# Patient Record
Sex: Female | Born: 1980 | Race: Black or African American | Hispanic: No | Marital: Married | Smoking: Never smoker
Health system: Southern US, Community
[De-identification: ages and names within clinical notes are randomized; demographics above are authoritative.]

## PROBLEM LIST (undated history)

## (undated) DIAGNOSIS — E282 Polycystic ovarian syndrome: Secondary | ICD-10-CM

## (undated) DIAGNOSIS — Z8619 Personal history of other infectious and parasitic diseases: Secondary | ICD-10-CM

## (undated) HISTORY — PX: PILONIDAL CYST DRAINAGE: SHX743

## (undated) HISTORY — PX: CHOLECYSTECTOMY: SHX55

---
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
## 2000-03-17 ENCOUNTER — Other Ambulatory Visit: Admission: RE | Admit: 2000-03-17 | Discharge: 2000-03-17 | Payer: Self-pay | Admitting: Family Medicine

## 2001-04-20 ENCOUNTER — Other Ambulatory Visit: Admission: RE | Admit: 2001-04-20 | Discharge: 2001-04-20 | Payer: Self-pay | Admitting: Family Medicine

## 2001-04-20 ENCOUNTER — Other Ambulatory Visit: Admission: RE | Admit: 2001-04-20 | Discharge: 2001-04-20 | Payer: Self-pay | Admitting: *Deleted

## 2002-10-17 ENCOUNTER — Other Ambulatory Visit: Admission: RE | Admit: 2002-10-17 | Discharge: 2002-10-17 | Payer: Self-pay | Admitting: Family Medicine

## 2003-01-31 ENCOUNTER — Ambulatory Visit (HOSPITAL_COMMUNITY): Admission: RE | Admit: 2003-01-31 | Discharge: 2003-01-31 | Payer: Self-pay | Admitting: *Deleted

## 2003-02-07 ENCOUNTER — Ambulatory Visit (HOSPITAL_COMMUNITY): Admission: RE | Admit: 2003-02-07 | Discharge: 2003-02-07 | Payer: Self-pay | Admitting: *Deleted

## 2003-04-12 ENCOUNTER — Ambulatory Visit (HOSPITAL_COMMUNITY): Admission: RE | Admit: 2003-04-12 | Discharge: 2003-04-12 | Payer: Self-pay | Admitting: Family Medicine

## 2003-06-13 ENCOUNTER — Ambulatory Visit (HOSPITAL_COMMUNITY): Admission: RE | Admit: 2003-06-13 | Discharge: 2003-06-13 | Payer: Self-pay | Admitting: Family Medicine

## 2003-06-30 ENCOUNTER — Inpatient Hospital Stay (HOSPITAL_COMMUNITY): Admission: AD | Admit: 2003-06-30 | Discharge: 2003-07-04 | Payer: Self-pay | Admitting: *Deleted

## 2004-09-12 ENCOUNTER — Other Ambulatory Visit: Admission: RE | Admit: 2004-09-12 | Discharge: 2004-09-12 | Payer: Self-pay | Admitting: Family Medicine

## 2005-09-11 ENCOUNTER — Other Ambulatory Visit: Admission: RE | Admit: 2005-09-11 | Discharge: 2005-09-11 | Payer: Self-pay | Admitting: Family Medicine

## 2007-03-14 ENCOUNTER — Emergency Department (HOSPITAL_COMMUNITY): Admission: EM | Admit: 2007-03-14 | Discharge: 2007-03-14 | Payer: Self-pay | Admitting: Emergency Medicine

## 2007-06-20 ENCOUNTER — Inpatient Hospital Stay (HOSPITAL_COMMUNITY): Admission: AD | Admit: 2007-06-20 | Discharge: 2007-06-20 | Payer: Self-pay | Admitting: Obstetrics and Gynecology

## 2008-01-07 ENCOUNTER — Other Ambulatory Visit: Payer: Self-pay | Admitting: Obstetrics and Gynecology

## 2008-01-10 ENCOUNTER — Inpatient Hospital Stay (HOSPITAL_COMMUNITY): Admission: RE | Admit: 2008-01-10 | Discharge: 2008-01-13 | Payer: Self-pay | Admitting: Obstetrics and Gynecology

## 2010-07-23 NOTE — Op Note (Signed)
NAME:  Michelle Leon, Michelle Leon              ACCOUNT NO.:  [id number]   MEDICAL RECORD NO.:  [id number]          PATIENT TYPE:  INP   LOCATION:  9199                          FACILITY:  [facility]   PHYSICIAN:  Zelphia Cairo, MD    DATE OF BIRTH:  07-20-1980   DATE OF PROCEDURE:  01/10/2008  DATE OF DISCHARGE:                               OPERATIVE REPORT   PREOPERATIVE DIAGNOSES:  1. Intrauterine pregnancy at term.  2. Prior cesarean section, desires repeat.   PROCEDURE:  Repeat low-transverse cesarean section.   SURGEON:  Zelphia Cairo, MD   ESTIMATED BLOOD LOSS:  500 mL.   FINDINGS:  Viable female infant with Apgars of 9 and 9, weight 7 pounds 12  ounces, normal pelvic anatomy.   URINE OUTPUT:  300 mL of clear urine.   FLUIDS:  2 L.   SPECIMEN:  Placenta for disposal.   COMPLICATIONS:  None.   CONDITION:  Stable to recovery room.   PROCEDURE:  The patient was taken to the operating room where spinal  anesthesia was found to be adequate.  She was placed in the supine  position with a left tilt.  She was prepped and draped in sterile  fashion.  A Foley catheter was inserted sterilely.  Pfannenstiel skin  incision was made with a scalpel and carried down to the underlying  fascia.  The fascia was incised in the midline.  This was extended  laterally using Mayo scissors.  Kocher clamps were used to grasp the  fascia and tented upwards.  The underlying rectus muscles were dissected  off using Mayo scissors.  The peritoneum was then identified and entered  sharply with Metzenbaum scissors.  This was extended superiorly and  inferiorly with good visualization of the bladder.  The bladder blade  was then inserted, the bladder flap was created, and the bladder blade  was reinserted to protect the bladder flap.   Uterine incision was made with a scalpel and extended bluntly using my  fingers.  The fetal vertex was brought to the uterine incision with  fundal pressure.  Right rectus  muscles were noted to be tight around the  fetal vertex.  Small incision was made using Mayo scissors on the medial  portion of the right rectus muscle.  Vacuum was applied; however, the  infant had a lot of hair, the vacuum would not allow traction.  After 2  pop-offs, the patient's bed was rotated to place her completely supine.  At this point, this allowed proper fundal pressure and the fetal vertex  was delivered through the uterine incision.  The mouth and nose were  suctioned and the shoulders and body easily followed.  The cord was  clamped and cut as the infant's mouth and nose were suctioned and the  infant was taken to the awaiting pediatric staff.  The placenta was then  manually removed from the uterus.  The uterus was cleared of all clots  and debris using a dry lap sponge.  The uterine incision was closed  using a double layer closure of 0 chromic in a running locked fashion.  Once hemostasis was noted, the uterus was placed back into the pelvis  and the pelvis was irrigated with warm normal saline.  The uterine  incision was re-inspected and again found to be hemostatic.  Peritoneum  was reapproximated using 0-Monocryl.  The fascia was closed with a  looped 0 PDS, and skin was closed staples.  The patient tolerated the  procedure well.  Sponge lap, needle, and instrument counts were correct  x2.      Zelphia Cairo, MD  Electronically Signed     GA/MEDQ  D:  01/10/2008  T:  01/10/2008  Job:  [id number]

## 2010-07-23 NOTE — Discharge Summary (Signed)
NAME:  Michelle Leon, Michelle Leon              ACCOUNT NO.:  [id number]   MEDICAL RECORD NO.:  [id number]          PATIENT TYPE:  INP   LOCATION:  9120                          [facility]ACILITY:  [facility]   PHYSICIAN:  Dineen Kid. Rana Snare, M.D.    DATE OF BIRTH:  April 30, 1980   DATE OF ADMISSION:  01/10/2008  DATE OF DISCHARGE:  01/13/2008                               DISCHARGE SUMMARY   ADMITTING DIAGNOSES:  1. Intrauterine pregnancy at 52 plus weeks estimated gestational age.  2. Previous caesarean section, desires repeat.   DISCHARGE DIAGNOSES:  1. Status post low transverse caesarean section.  2. Viable female infant.   PROCEDURE:  Repeat low transverse caesarean section.   REASON FOR ADMISSION:  Please see written H and P.   HOSPITAL COURSE:  The patient is a 30 year old African American married  female gravida 2, para 1 who was admitted to [facility]  for scheduled cesarean section.  The patient had had a previous cesarean  section and desired repeat.  On the morning of admission, the patient  was taken to operating room where spinal anesthesia was administered  without difficulty.  A low transverse incision was made, delivery of a  viable female infant weighing 7 pounds 12 ounces with Apgars of 9 at 1  minute and 9 at 5 minutes.  The patient tolerated the procedure well and  was taken to the recovery room in stable condition.  Postoperative day  #1, the patient was without complaint.  Vital signs stable.  She is  afebrile.  Abdomen is soft.  Fundus is firm and nontender.  Abdominal  dressing was noted to be clean, dry, and intact.  Foley had been  discontinued and the patient did not void at the time of roundings.  Laboratory findings revealed hemoglobin of 11.9, platelet count of  163,000, WBC 7.6,  blood type is noted to be AB positive.  The following  morning the patient was without complaints.  Vitals signs remained  stable.  She was afebrile.  Fundus firm and nontender.  Incision was  clean, dry, and intact.  She was ambulating well.  Postoperative day #3,  the patient was without complaint.  Vital signs remained stable.  She is  afebrile.  Abdomen is soft.  Fundus is firm and nontender.  Incision was  clean, dry, and intact.  Staples were removed and the patient was later  discharged home.   CONDITION ON DISCHARGE:  Stable.   DIET:  Regular as tolerated.   ACTIVITY:  No heavy lifting, no driving x2 weeks, no vaginal entry.   FOLLOWUP:  The patient is to follow up in the office in 1-2 weeks for  incision check.  She is to call for temperature greater than 100  degrees, persistent nausea, vomiting, heavy vaginal bleeding, and/or  redness or drainage at incisional site.   DISCHARGE MEDICATIONS:  1. Percocet 5/325 #30 one p.o. q.4-6 h. p.r.n.  2. Motrin 600 mg q.6 h.  3. Prenatal vitamins 1 p.o. daily.  4. Colace 1 p.o. daily p.r.n.      Julio Sicks, N.P.  Dineen Kid Rana Snare, M.D.  Electronically Signed    CC/MEDQ  D:  01/13/2008  T:  01/13/2008  Job:  [id number]

## 2010-07-26 NOTE — Discharge Summary (Signed)
NAME:  Michelle Leon, DIEFENDORF                        ACCOUNT NO.:  [id number]   MEDICAL RECORD NO.:  [id number]                   PATIENT TYPE:  INP   LOCATION:  9110                                 FACILITY:  [facility]   PHYSICIAN:  Magnus Sinning. Rice, M.D.              DATE OF BIRTH:  21-Oct-1980   DATE OF ADMISSION:  06/30/2003  DATE OF DISCHARGE:  07/04/2003                                 DISCHARGE SUMMARY   DISCHARGE DIAGNOSES:  1. Intrauterine pregnancy at 39 weeks, delivered.  2. Low transverse cesarean section.  3. Failure to descend.  4. Persistent right occiput posterior position.  5. Fever in labor.   DISCHARGE MEDICATIONS:  1. Percocet 5/325 one to two p.o. q.4-6h. p.r.n. pain.  2. Motrin 600 mg one p.o. t.i.d. with food.  3. Micronor start on Sunday, Jul 14, 2003.  4. Prenatal vitamins one per day as long as breastfeeding.   DISCHARGE INSTRUCTIONS:  Discharged to home with follow-up at [facility] in 6 weeks.   HISTORY OF PRESENT ILLNESS:  This is a 30 year old G1 at 44 weeks by a sure  normal LMP but 10 days behind by a 17-week ultrasound.  Onset of strong  contractions on the day of admission with a gush of clear fluid.  No vaginal  bleeding and good fetal movement were noted.  This pregnancy was complicated  by second trimester bleeding, not recurrent.   PAST MEDICAL HISTORY:  None.  An antiphospholipid workup was negative.   MEDICATIONS:  Prenatal vitamins and Flagyl.   ALLERGIES:  None.   PAST OBSTETRICAL HISTORY:  In July 2000 at TAB at 4 weeks.   PRENATAL LABORATORY DATA:  AB positive, antibody negative.  RPR nonreactive.  Rubella equivocal.  HB surface antigen negative.  HIV negative.  Pap within  normal limits.  GC and chlamydia were negative.  Glucola was 102.  GBS was  negative on May 29, 2003.   SOCIAL HISTORY:  Planned breastfeeding, [facility] for pediatrics,  and OCPs postpartum.   PHYSICAL EXAMINATION:  Normal at  admission with fetal heart tones 145-150,  accelerations present, occasional mild variables, and contractions every 2-3  minutes.  SVE on admission was 4-5 cm, 95% effaced, vertex presentation, and  -1 station.  There was copious clear fluid.   The patient was therefore admitted.  Her labor was followed throughout the  evening.  She was noted to have a fever to 99.5 with a slightly increased  fetal heart tone baseline at about 2:15 a.m. on April 23.  She was therefore  started on ampicillin and given Tylenol.  The patient was noted to be  completely dilated at 5:45 a.m. and pushing for an hour with good effort  noted and not much change in station.  Position at that time was noted to be  right occiput transverse.  The patient was continued on pushing for  approximately a further  hour-and-a-half and at 9 a.m. after trying various  positions with the vertex unchanged in station the decision was decided to  progress to primary low transverse cesarean section.  Dr. Penne Lash was  consulted in this and agreed to the surgery as well and participated as the  primary.  The patient was easily delivered by primary LTCS, a viable female  with Apgars of 9 at one and 9 at five and a 7-pound 10-ounce baby was the  result.  The patient recovered well after her surgery and was discharged on  July 04, 2003 with the discharge diagnoses and medications as listed above.                                               Magnus Sinning. Rice, M.D.    KMR/MEDQ  D:  07/30/2003  T:  07/31/2003  Job:  [id number]

## 2010-07-26 NOTE — Op Note (Signed)
NAME:  Michelle Leon, Michelle Leon                        ACCOUNT NO.:  [id number]   MEDICAL RECORD NO.:  [id number]                   PATIENT TYPE:  INP   LOCATION:  9110                                 [facility]ACILITY:  [facility]   PHYSICIAN:  Lesly Dukes, M.D.              DATE OF BIRTH:  1981-02-19   DATE OF PROCEDURE:  07/01/2003  DATE OF DISCHARGE:  07/04/2003                                 OPERATIVE REPORT   PREOPERATIVE DIAGNOSES:  1. Intrauterine pregnancy at 39 weeks.  2. Failure to descend.  3. Persistent right occiput posterior position.  4. Fever and labor.   POSTOPERATIVE DIAGNOSES:  1. Intrauterine pregnancy at 39 weeks.  2. Failure to descend.  3. Persistent right occiput posterior position.  4. Fever and labor.   PROCEDURE:  Primary low transverse cesarean section by a Pfannenstiel  incision.   SURGEON:  Lesly Dukes, M.D.   ASSISTANT:  Magnus Sinning. Rice, M.D.   FINDINGS:  A viable female infant ROP position, 7 pounds 10 ounces, Apgar at  9 at 1 and 9 at 5 minutes.   ESTIMATED BLOOD LOSS:  750 mL   URINE OUTPUT:  400 mL   FLUIDS:  2200 of LR.   ANESTHESIA:  Epidural.   COMPLICATIONS:  None.   The patient was taken to the operating room where the epidural anesthesia  was dosed and then found to be adequate. She was prepped and draped in the  normal sterile fashion in dorsal supine position. A Pfannenstiel skin  incision was made with the scalpel and carried through to the underlying  layer of fascia with the Bovie. The fascia was nicked in the midline and the  incision extended laterally with Mayo scissors. The inferior aspect of the  fascial incision was grasped with Kocher clamps, elevated and the underlying  rectus muscles dissected off bluntly. The superior aspect of the fascial  incision was treated the same way. The rectus muscles were separated in the  midline and perineum identified and tinted up with hemostats, entered  sharply with Metzenbaum scissors.   The peritoneal incision was extended  superiorly and inferiorly with good visualization of the bladder.  The  bladder blade was inserted and the vesicouterine peritoneum identified,  grasped with pickups and entered sharply with Metzenbaum scissors.  This  incision was extended laterally and the bladder flap created digitally. The  bladder blade was reinserted and the lower uterine segment incised in a  transverse fashion with a scalpel.  The uterine incision was extended  laterally digitally.  The bladder blade was removed and the infant's head  was delivered atraumatically.  Nose and mouth were suctioned with the  suction bulb, the cord then clamped and cut. The infant was handed off to  waiting pediatricians and cord blood was drawn.  The placenta was removed  manually, the uterus cleared of all clots and debris and then the uterine  incision  was repaired with Vicryl in a running locked fashion. A second  layer of the same suture was used to obtain incision stability and extra  hemostasis. The gutters were cleared of all clots and the fascia was  reapproximated with #0 Vicryl in a running fashion. The skin was closed with  staples.  The patient tolerated the procedure well, sponge, lap and needle  counts were correct x2.  Ancef 1 g was given at cord clamp and the patient  was sent to the recovery room in stable condition.     Magnus Sinning. Dimple Casey, M.D.                    Lesly Dukes, M.D.    KMR/MEDQ  D:  07/04/2003  T:  07/04/2003  Job:  [id number]   cc:   Lesly Dukes, M.D.

## 2010-12-03 LAB — ABO/RH: ABO/RH(D): AB POS

## 2010-12-09 LAB — CBC
HCT: 37
Hemoglobin: 12.4
MCHC: 33.5
MCV: 93.3
Platelets: 166
RBC: 3.96
RDW: 12.8
WBC: 5.6

## 2010-12-09 LAB — RPR: RPR Ser Ql: NONREACTIVE

## 2010-12-09 LAB — TYPE AND SCREEN
ABO/RH(D): AB POS
Antibody Screen: NEGATIVE

## 2010-12-09 LAB — ABO/RH: ABO/RH(D): AB POS

## 2010-12-10 LAB — CBC
HCT: 35.3 — ABNORMAL LOW
Hemoglobin: 11.9 — ABNORMAL LOW
MCHC: 33.7
MCV: 93.9
Platelets: 163
RBC: 3.76 — ABNORMAL LOW
RDW: 12.5
WBC: 7.6

## 2012-11-04 ENCOUNTER — Ambulatory Visit: Payer: Self-pay | Admitting: Nurse Practitioner

## 2012-11-04 ENCOUNTER — Encounter: Payer: Self-pay | Admitting: *Deleted

## 2012-11-04 ENCOUNTER — Telehealth: Payer: Self-pay | Admitting: Nurse Practitioner

## 2012-11-04 ENCOUNTER — Emergency Department
Admission: EM | Admit: 2012-11-04 | Discharge: 2012-11-04 | Disposition: A | Discharge status: Home / Self Care | Payer: BC Managed Care – PPO | Source: Home / Self Care | Attending: Family Medicine | Admitting: Family Medicine

## 2012-11-04 DIAGNOSIS — L732 Hidradenitis suppurativa: Secondary | ICD-10-CM

## 2012-11-04 LAB — POCT CBC W AUTO DIFF (K'VILLE URGENT CARE)

## 2012-11-04 MED ORDER — DOXYCYCLINE HYCLATE 100 MG PO CAPS: 100.0000 mg | ORAL_CAPSULE | Freq: Two times a day (BID) | ORAL | Status: AC

## 2012-11-04 MED ORDER — MELOXICAM 15 MG PO TABS: 15.0000 mg | ORAL_TABLET | Freq: Every day | ORAL | Status: DC

## 2012-11-04 NOTE — Telephone Encounter (Signed)
APPT MADE

## 2012-11-04 NOTE — ED Notes (Signed)
Patient c/o boil on right axilla x 1 wk patient states the boil busted yesterday. 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 Patient also c/o knot on right mid arm.

## 2012-11-04 NOTE — ED Provider Notes (Addendum)
CSN: [id number]     Arrival date & time 11/04/12  1839 History   First MD Initiated Contact with Patient 11/04/12 1908     Chief Complaint  Patient presents with  . Recurrent Skin Infections    HPI R axillary pain x 1 week  Shaves under her arms every other day.  Has had redness and purulent drainage No fevers or chills.   Also with R proximal lateral forearm pain and swelling Present over last 1-2 days.  No known injury.  Mild pain with wrist pronation/supaination.  Has noticed mild tingling over affected area.  Grip strength intact.  Pt denies doing any heavy manual labor.     Past Medical History  Diagnosis Date  . MRSA (methicillin resistant Staphylococcus aureus)    Past Surgical History  Procedure Laterality Date  . Cholecystectomy    . Cesarean section     Family History  Problem Relation Age of Onset  . Hypertension Other   . Hyperlipidemia Other    History  Substance Use Topics  . Smoking status: Never Smoker   . Smokeless tobacco: Not on file  . Alcohol Use: No   OB History   Grav Para Term Preterm Abortions TAB SAB Ect Mult Living                 Review of Systems  All other systems reviewed and are negative.    Allergies  Review of patient's allergies indicates no known allergies.  Home Medications   Current Outpatient Rx  Name  Route  Sig  Dispense  Refill  . levonorgestrel (MIRENA) 20 MCG/24HR IUD   Intrauterine   1 each by Intrauterine route once.          BP 128/85  Pulse 73  Temp(Src) 98.8 F (37.1 C) (Oral)  Resp 16  Ht 4\' 11"  (1.499 m)  Wt 173 lb (78.472 kg)  BMI 34.92 kg/m2  SpO2 99% Physical Exam  Constitutional: She appears well-developed and well-nourished.  HENT:  Head: Normocephalic and atraumatic.  Eyes: Conjunctivae are normal. Pupils are equal, round, and reactive to light.  Neck: Normal range of motion.  Cardiovascular: Normal rate and regular rhythm.   Pulmonary/Chest: Effort normal.  Abdominal: Soft.    Musculoskeletal:       Arms: Skin: Skin is warm.       ED Course  Procedures (including critical care time) Labs Review Labs Reviewed  SEDIMENTATION RATE  CK  POCT CBC W AUTO DIFF ([facility])   Imaging Review No results found.  MDM   1. Hidradenitis   2. Tendinitis of forearm    Will place on the hidradenitis coverage. Discuss avoiding shaving he Discuss infectious red flags.  Differential diagnosis for right forearm includes lateral epicondylitis, forearm tendinitis/tenosynovitis. Placed on Mobic for inflammatory coverage. Check baseline labs including CBC, CK, sedimentation rate. No leukocytosis today. Discussed with patient if symptoms don't improve the next 1-2 days to followup with sports medicine to rule out something like a cyst.  Also discussed MSK red flags including worsening pain, swelling, numbness.  Patient expressed understanding of plan. Followup as needed    The patient and/or caregiver has been counseled thoroughly with regard to treatment plan and/or medications prescribed including dosage, schedule, interactions, rationale for use, and possible side effects and they verbalize understanding. Diagnoses and expected course of recovery discussed and will return if not improved as expected or if the condition worsens. Patient and/or caregiver verbalized understanding.  Doree Albee, MD 11/04/12 Ninfa Linden  Doree Albee, MD 11/04/12 [phone]

## 2012-11-05 LAB — SEDIMENTATION RATE: Sed Rate: 24 mm/hr — ABNORMAL HIGH (ref 0–22)

## 2012-11-05 LAB — CK: Total CK: 143 U/L (ref 7–177)

## 2012-11-08 ENCOUNTER — Telehealth: Payer: Self-pay | Admitting: Emergency Medicine

## 2012-12-24 ENCOUNTER — Ambulatory Visit (INDEPENDENT_AMBULATORY_CARE_PROVIDER_SITE_OTHER): Payer: BC Managed Care – PPO | Admitting: General Practice

## 2012-12-24 ENCOUNTER — Telehealth: Payer: Self-pay | Admitting: Nurse Practitioner

## 2012-12-24 ENCOUNTER — Encounter: Payer: Self-pay | Admitting: General Practice

## 2012-12-24 VITALS — BP 106/74 | HR 70 | Temp 99.9°F | Ht 59.0 in | Wt 168.0 lb

## 2012-12-24 DIAGNOSIS — R51 Headache: Secondary | ICD-10-CM

## 2012-12-24 DIAGNOSIS — R509 Fever, unspecified: Secondary | ICD-10-CM

## 2012-12-24 DIAGNOSIS — R42 Dizziness and giddiness: Secondary | ICD-10-CM

## 2012-12-24 DIAGNOSIS — R110 Nausea: Secondary | ICD-10-CM

## 2012-12-24 LAB — POCT CBC
Granulocyte percent: 79.9 %G (ref 37–80)
HCT, POC: 39.6 % (ref 37.7–47.9)
Hemoglobin: 13.4 g/dL (ref 12.2–16.2)
Lymph, poc: 0.7 (ref 0.6–3.4)
MCH, POC: 30.4 pg (ref 27–31.2)
MCHC: 34 g/dL (ref 31.8–35.4)
MCV: 89.4 fL (ref 80–97)
MPV: 6.9 fL (ref 0–99.8)
POC Granulocyte: 4.2 (ref 2–6.9)
POC LYMPH PERCENT: 13.5 %L (ref 10–50)
Platelet Count, POC: 205 10*3/uL (ref 142–424)
RBC: 4.4 M/uL (ref 4.04–5.48)
RDW, POC: 11.3 %
WBC: 5.2 10*3/uL (ref 4.6–10.2)

## 2012-12-24 LAB — POCT INFLUENZA A/B
Influenza A, POC: NEGATIVE
Influenza B, POC: NEGATIVE

## 2012-12-24 LAB — POCT RAPID STREP A (OFFICE): Rapid Strep A Screen: NEGATIVE

## 2012-12-24 MED ORDER — KETOROLAC TROMETHAMINE 60 MG/2ML IM SOLN
60.0000 mg | Freq: Once | INTRAMUSCULAR | Status: AC
  Administered 2012-12-24: 60 mg via INTRAMUSCULAR

## 2012-12-24 MED ORDER — ONDANSETRON HCL 4 MG PO TABS
4.0000 mg | ORAL_TABLET | Freq: Once | ORAL | Status: AC
  Administered 2012-12-24: 4 mg via ORAL

## 2012-12-24 MED ORDER — ONDANSETRON HCL 4 MG PO TABS: 4.0000 mg | ORAL_TABLET | Freq: Three times a day (TID) | ORAL | Status: DC | PRN

## 2012-12-24 MED ORDER — NAPROXEN 500 MG PO TABS: 500.0000 mg | ORAL_TABLET | Freq: Two times a day (BID) | ORAL | Status: DC

## 2012-12-24 NOTE — Telephone Encounter (Signed)
appt made

## 2012-12-24 NOTE — Progress Notes (Signed)
  Subjective:    Patient ID: Michelle Leon, female    DOB: 04/24/1980, 32 y.o.   MRN: [id number]  HPI Patient presents today with complaints of dizziness and headache since lastnight. She reports one episode of vomiting lastnight. Reports having a son who was diagnosed with strep yesterday. Patient denies suffering from any recent illnesses. Denies taking OTC medications. [name] describes her headache as frontal, aching, non radiating, sensitive to light and sound. She denies headaches like this in the past with dizziness.     Review of Systems  Constitutional: Negative for fever and chills.  HENT: Negative for congestion, ear pain, postnasal drip, sinus pressure and sore throat.   Eyes: Negative for pain and discharge.  Respiratory: Negative for chest tightness and shortness of breath.   Gastrointestinal: Positive for nausea and vomiting. Negative for abdominal pain, diarrhea, constipation and blood in stool.       Vomited once lastnight  Genitourinary: Negative for hematuria, vaginal bleeding and difficulty urinating.  Musculoskeletal: Negative for neck pain.  Neurological: Positive for dizziness. Negative for weakness and numbness.       Objective:   Physical Exam  Constitutional: She is oriented to person, place, and time. She appears well-developed and well-nourished.  HENT:  Head: Normocephalic and atraumatic.  Right Ear: External ear normal.  Left Ear: External ear normal.  Nose: Nose normal.  Mouth/Throat: Oropharynx is clear and moist.  Eyes: EOM are normal. Pupils are equal, round, and reactive to light.  Neck: Normal range of motion. Neck supple. No thyromegaly present.  Cardiovascular: Normal rate, regular rhythm and normal heart sounds.   Pulmonary/Chest: Effort normal and breath sounds normal. No respiratory distress. She exhibits no tenderness.  Abdominal: Soft. Bowel sounds are normal. She exhibits no distension. There is no tenderness.  Musculoskeletal: She  exhibits no edema and no tenderness.  Lymphadenopathy:    She has no cervical adenopathy.  Neurological: She is alert and oriented to person, place, and time. No cranial nerve deficit. Coordination normal.  Skin: Skin is warm and dry.  Psychiatric: She has a normal mood and affect.          Assessment & Plan:  1. Fever  - POCT Influenza A/B - POCT CBC - POCT rapid strep A  2. Headache(784.0)  - POCT Influenza A/B - POCT CBC - POCT rapid strep A - ketorolac (TORADOL) injection 60 mg; Inject 2 mLs (60 mg total) into the muscle once. - naproxen (NAPROSYN) 500 MG tablet; Take 1 tablet (500 mg total) by mouth 2 (two) times daily with a meal.  Dispense: 30 tablet; Refill: 0  3. Dizziness  - POCT Influenza A/B - POCT CBC  4. Nausea alone  - ondansetron (ZOFRAN) tablet 4 mg; Take 1 tablet (4 mg total) by mouth once. - ondansetron (ZOFRAN) 4 MG tablet; Take 1 tablet (4 mg total) by mouth every 8 (eight) hours as needed for nausea.  Dispense: 20 tablet; Refill: 0 -increase fluids as tolerated -seek emergency medical treatment if symptoms worsen or unresolved -Patient verbalized understanding -Coralie Keens, FNP-C

## 2012-12-24 NOTE — Patient Instructions (Signed)
Headaches, Frequently Asked Questions MIGRAINE HEADACHES Q: What is migraine? What causes it? How can I treat it? A: Generally, migraine headaches begin as a dull ache. Then they develop into a constant, throbbing, and pulsating pain. You may experience pain at the temples. You may experience pain at the front or back of one or both sides of the head. The pain is usually accompanied by a combination of:  Nausea.  Vomiting.  Sensitivity to light and noise. Some people (about 15%) experience an aura (see below) before an attack. The cause of migraine is believed to be chemical reactions in the brain. Treatment for migraine may include over-the-counter or prescription medications. It may also include self-help techniques. These include relaxation training and biofeedback.  Q: What is an aura? A: About 15% of people with migraine get an "aura". This is a sign of neurological symptoms that occur before a migraine headache. You may see wavy or jagged lines, dots, or flashing lights. You might experience tunnel vision or blind spots in one or both eyes. The aura can include visual or auditory hallucinations (something imagined). It may include disruptions in smell (such as strange odors), taste or touch. Other symptoms include:  Numbness.  A "pins and needles" sensation.  Difficulty in recalling or speaking the correct word. These neurological events may last as long as 60 minutes. These symptoms will fade as the headache begins. Q: What is a trigger? A: Certain physical or environmental factors can lead to or "trigger" a migraine. These include:  Foods.  Hormonal changes.  Weather.  Stress. It is important to remember that triggers are different for everyone. To help prevent migraine attacks, you need to figure out which triggers affect you. Keep a headache diary. This is a good way to track triggers. The diary will help you talk to your healthcare professional about your condition. Q: Does  weather affect migraines? A: Bright sunshine, hot, humid conditions, and drastic changes in barometric pressure may lead to, or "trigger," a migraine attack in some people. But studies have shown that weather does not act as a trigger for everyone with migraines. Q: What is the link between migraine and hormones? A: Hormones start and regulate many of your body's functions. Hormones keep your body in balance within a constantly changing environment. The levels of hormones in your body are unbalanced at times. Examples are during menstruation, pregnancy, or menopause. That can lead to a migraine attack. In fact, about three quarters of all women with migraine report that their attacks are related to the menstrual cycle.  Q: Is there an increased risk of stroke for migraine sufferers? A: The likelihood of a migraine attack causing a stroke is very remote. That is not to say that migraine sufferers cannot have a stroke associated with their migraines. In persons under age 40, the most common associated factor for stroke is migraine headache. But over the course of a person's normal life span, the occurrence of migraine headache may actually be associated with a reduced risk of dying from cerebrovascular disease due to stroke.  Q: What are acute medications for migraine? A: Acute medications are used to treat the pain of the headache after it has started. Examples over-the-counter medications, NSAIDs, ergots, and triptans.  Q: What are the triptans? A: Triptans are the newest class of abortive medications. They are specifically targeted to treat migraine. Triptans are vasoconstrictors. They moderate some chemical reactions in the brain. The triptans work on receptors in your brain. Triptans help   to restore the balance of a neurotransmitter called serotonin. Fluctuations in levels of serotonin are thought to be a main cause of migraine.  Q: Are over-the-counter medications for migraine effective? A:  Over-the-counter, or "OTC," medications may be effective in relieving mild to moderate pain and associated symptoms of migraine. But you should see your caregiver before beginning any treatment regimen for migraine.  Q: What are preventive medications for migraine? A: Preventive medications for migraine are sometimes referred to as "prophylactic" treatments. They are used to reduce the frequency, severity, and length of migraine attacks. Examples of preventive medications include antiepileptic medications, antidepressants, beta-blockers, calcium channel blockers, and NSAIDs (nonsteroidal anti-inflammatory drugs). Q: Why are anticonvulsants used to treat migraine? A: During the past few years, there has been an increased interest in antiepileptic drugs for the prevention of migraine. They are sometimes referred to as "anticonvulsants". Both epilepsy and migraine may be caused by similar reactions in the brain.  Q: Why are antidepressants used to treat migraine? A: Antidepressants are typically used to treat people with depression. They may reduce migraine frequency by regulating chemical levels, such as serotonin, in the brain.  Q: What alternative therapies are used to treat migraine? A: The term "alternative therapies" is often used to describe treatments considered outside the scope of conventional Western medicine. Examples of alternative therapy include acupuncture, acupressure, and yoga. Another common alternative treatment is herbal therapy. Some herbs are believed to relieve headache pain. Always discuss alternative therapies with your caregiver before proceeding. Some herbal products contain arsenic and other toxins. TENSION HEADACHES Q: What is a tension-type headache? What causes it? How can I treat it? A: Tension-type headaches occur randomly. They are often the result of temporary stress, anxiety, fatigue, or anger. Symptoms include soreness in your temples, a tightening band-like sensation  around your head (a "vice-like" ache). Symptoms can also include a pulling feeling, pressure sensations, and contracting head and neck muscles. The headache begins in your forehead, temples, or the back of your head and neck. Treatment for tension-type headache may include over-the-counter or prescription medications. Treatment may also include self-help techniques such as relaxation training and biofeedback. CLUSTER HEADACHES Q: What is a cluster headache? What causes it? How can I treat it? A: Cluster headache gets its name because the attacks come in groups. The pain arrives with little, if any, warning. It is usually on one side of the head. A tearing or bloodshot eye and a runny nose on the same side of the headache may also accompany the pain. Cluster headaches are believed to be caused by chemical reactions in the brain. They have been described as the most severe and intense of any headache type. Treatment for cluster headache includes prescription medication and oxygen. SINUS HEADACHES Q: What is a sinus headache? What causes it? How can I treat it? A: When a cavity in the bones of the face and skull (a sinus) becomes inflamed, the inflammation will cause localized pain. This condition is usually the result of an allergic reaction, a tumor, or an infection. If your headache is caused by a sinus blockage, such as an infection, you will probably have a fever. An x-ray will confirm a sinus blockage. Your caregiver's treatment might include antibiotics for the infection, as well as antihistamines or decongestants.  REBOUND HEADACHES Q: What is a rebound headache? What causes it? How can I treat it? A: A pattern of taking acute headache medications too often can lead to a condition known as "rebound headache."   A pattern of taking too much headache medication includes taking it more than 2 days per week or in excessive amounts. That means more than the label or a caregiver advises. With rebound  headaches, your medications not only stop relieving pain, they actually begin to cause headaches. Doctors treat rebound headache by tapering the medication that is being overused. Sometimes your caregiver will gradually substitute a different type of treatment or medication. Stopping may be a challenge. Regularly overusing a medication increases the potential for serious side effects. Consult a caregiver if you regularly use headache medications more than 2 days per week or more than the label advises. ADDITIONAL QUESTIONS AND ANSWERS Q: What is biofeedback? A: Biofeedback is a self-help treatment. Biofeedback uses special equipment to monitor your body's involuntary physical responses. Biofeedback monitors:  Breathing.  Pulse.  Heart rate.  Temperature.  Muscle tension.  Brain activity. Biofeedback helps you refine and perfect your relaxation exercises. You learn to control the physical responses that are related to stress. Once the technique has been mastered, you do not need the equipment any more. Q: Are headaches hereditary? A: Four out of five (80%) of people that suffer report a family history of migraine. Scientists are not sure if this is genetic or a family predisposition. Despite the uncertainty, a child has a 50% chance of having migraine if one parent suffers. The child has a 75% chance if both parents suffer.  Q: Can children get headaches? A: By the time they reach high school, most young people have experienced some type of headache. Many safe and effective approaches or medications can prevent a headache from occurring or stop it after it has begun.  Q: What type of doctor should I see to diagnose and treat my headache? A: Start with your primary caregiver. Discuss his or her experience and approach to headaches. Discuss methods of classification, diagnosis, and treatment. Your caregiver may decide to recommend you to a headache specialist, depending upon your symptoms or other  physical conditions. Having diabetes, allergies, etc., may require a more comprehensive and inclusive approach to your headache. The National Headache Foundation will provide, upon request, a list of [facility] physician members in your state. Document Released: 05/17/2003 Document Revised: 05/19/2011 Document Reviewed: 10/25/2007 [facility] Patient Information 2014 [place], [place].  Dizziness Dizziness is a common problem. It is a feeling of unsteadiness or lightheadedness. You may feel like you are about to faint. Dizziness can lead to injury if you stumble or fall. A person of any age group can suffer from dizziness, but dizziness is more common in older adults. CAUSES  Dizziness can be caused by many different things, including:  Middle ear problems.  Standing for too long.  Infections.  An allergic reaction.  Aging.  An emotional response to something, such as the sight of blood.  Side effects of medicines.  Fatigue.  Problems with circulation or blood pressure.  Excess use of alcohol, medicines, or illegal drug use.  Breathing too fast (hyperventilation).  An arrhythmia or problems with your heart rhythm.  Low red blood cell count (anemia).  Pregnancy.  Vomiting, diarrhea, fever, or other illnesses that cause dehydration.  Diseases or conditions such as Parkinson's disease, high blood pressure (hypertension), diabetes, and thyroid problems.  Exposure to extreme heat. DIAGNOSIS  To find the cause of your dizziness, your caregiver may do a physical exam, lab tests, radiologic imaging scans, or an electrocardiography test (ECG).  TREATMENT  Treatment of dizziness depends on the cause of your  symptoms and can vary greatly. HOME CARE INSTRUCTIONS   Drink enough fluids to keep your urine clear or pale yellow. This is especially important in very hot weather. In the elderly, it is also important in cold weather.  If your dizziness is caused by medicines, take them exactly as  directed. When taking blood pressure medicines, it is especially important to get up slowly.  Rise slowly from chairs and steady yourself until you feel okay.  In the morning, first sit up on the side of the bed. When this seems okay, stand slowly while holding onto something until you know your balance is fine.  If you need to stand in one place for a long time, be sure to move your legs often. Tighten and relax the muscles in your legs while standing.  If dizziness continues to be a problem, have someone stay with you for a day or two. Do this until you feel you are well enough to stay alone. Have the person call your caregiver if he or she notices changes in you that are concerning.  Do not drive or use heavy machinery if you feel dizzy.  Do not drink alcohol. SEEK IMMEDIATE MEDICAL CARE IF:   Your dizziness or lightheadedness gets worse.  You feel nauseous or vomit.  You develop problems with talking, walking, weakness, or using your arms, hands, or legs.  You are not thinking clearly or you have difficulty forming sentences. It may take a friend or family member to determine if your thinking is normal.  You develop chest pain, abdominal pain, shortness of breath, or sweating.  Your vision changes.  You notice any bleeding.  You have side effects from medicine that seems to be getting worse rather than better. MAKE SURE YOU:   Understand these instructions.  Will watch your condition.  Will get help right away if you are not doing well or get worse. Document Released: 08/20/2000 Document Revised: 05/19/2011 Document Reviewed: 09/13/2010 [facility] Patient Information 2014 [place], [place].

## 2014-07-25 ENCOUNTER — Ambulatory Visit (INDEPENDENT_AMBULATORY_CARE_PROVIDER_SITE_OTHER): Payer: BLUE CROSS/BLUE SHIELD

## 2014-07-25 ENCOUNTER — Encounter (INDEPENDENT_AMBULATORY_CARE_PROVIDER_SITE_OTHER): Payer: Self-pay

## 2014-07-25 ENCOUNTER — Ambulatory Visit (INDEPENDENT_AMBULATORY_CARE_PROVIDER_SITE_OTHER): Payer: BLUE CROSS/BLUE SHIELD | Admitting: Physician Assistant

## 2014-07-25 ENCOUNTER — Encounter: Payer: Self-pay | Admitting: Physician Assistant

## 2014-07-25 VITALS — BP 117/84 | HR 68 | Temp 98.4°F | Ht 59.0 in | Wt 175.0 lb

## 2014-07-25 DIAGNOSIS — R062 Wheezing: Secondary | ICD-10-CM

## 2014-07-25 DIAGNOSIS — J209 Acute bronchitis, unspecified: Secondary | ICD-10-CM | POA: Diagnosis not present

## 2014-07-25 MED ORDER — PREDNISONE 10 MG (21) PO TBPK: ORAL_TABLET | ORAL | Status: DC

## 2014-07-25 MED ORDER — ALBUTEROL SULFATE HFA 108 (90 BASE) MCG/ACT IN AERS: 2.0000 | INHALATION_SPRAY | Freq: Four times a day (QID) | RESPIRATORY_TRACT | Status: DC | PRN

## 2014-07-25 MED ORDER — AZITHROMYCIN 250 MG PO TABS: ORAL_TABLET | ORAL | Status: DC

## 2014-07-25 NOTE — Patient Instructions (Signed)
Take OTC plain musinex as directed   Acute Bronchitis Bronchitis is when the airways that extend from the windpipe into the lungs get red, puffy, and painful (inflamed). Bronchitis often causes thick spit (mucus) to develop. This leads to a cough. A cough is the most common symptom of bronchitis. In acute bronchitis, the condition usually begins suddenly and goes away over time (usually in 2 weeks). Smoking, allergies, and asthma can make bronchitis worse. Repeated episodes of bronchitis may cause more lung problems. HOME CARE  Rest.  Drink enough fluids to keep your pee (urine) clear or pale yellow (unless you need to limit fluids as told by your doctor).  Only take over-the-counter or prescription medicines as told by your doctor.  Avoid smoking and secondhand smoke. These can make bronchitis worse. If you are a smoker, think about using nicotine gum or skin patches. Quitting smoking will help your lungs heal faster.  Reduce the chance of getting bronchitis again by:  Washing your hands often.  Avoiding people with cold symptoms.  Trying not to touch your hands to your mouth, nose, or eyes.  Follow up with your doctor as told. GET HELP IF: Your symptoms do not improve after 1 week of treatment. Symptoms include:  Cough.  Fever.  Coughing up thick spit.  Body aches.  Chest congestion.  Chills.  Shortness of breath.  Sore throat. GET HELP RIGHT AWAY IF:   You have an increased fever.  You have chills.  You have severe shortness of breath.  You have bloody thick spit (sputum).  You throw up (vomit) often.  You lose too much body fluid (dehydration).  You have a severe headache.  You faint. MAKE SURE YOU:   Understand these instructions.  Will watch your condition.  Will get help right away if you are not doing well or get worse. Document Released: 08/13/2007 Document Revised: 10/27/2012 Document Reviewed: 08/17/2012 [facility] Patient Information  2015 [place], [place]. This information is not intended to replace advice given to you by your health care provider. Make sure you discuss any questions you have with your health care provider. Upper Respiratory Infection, Adult An upper respiratory infection (URI) is also known as the common cold. It is often caused by a type of germ (virus). Colds are easily spread (contagious). You can pass it to others by kissing, coughing, sneezing, or drinking out of the same glass. Usually, you get better in 1 or 2 weeks.  HOME CARE   Only take medicine as told by your doctor.  Use a warm mist humidifier or breathe in steam from a hot shower.  Drink enough water and fluids to keep your pee (urine) clear or pale yellow.  Get plenty of rest.  Return to work when your temperature is back to normal or as told by your doctor. You may use a face mask and wash your hands to stop your cold from spreading. GET HELP RIGHT AWAY IF:   After the first few days, you feel you are getting worse.  You have questions about your medicine.  You have chills, shortness of breath, or brown or red spit (mucus).  You have yellow or brown snot (nasal discharge) or pain in the face, especially when you bend forward.  You have a fever, puffy (swollen) neck, pain when you swallow, or white spots in the back of your throat.  You have a bad headache, ear pain, sinus pain, or chest pain.  You have a high-pitched whistling sound when you  breathe in and out (wheezing).  You have a lasting cough or cough up blood.  You have sore muscles or a stiff neck. MAKE SURE YOU:   Understand these instructions.  Will watch your condition.  Will get help right away if you are not doing well or get worse. Document Released: 08/13/2007 Document Revised: 05/19/2011 Document Reviewed: 06/01/2013 [facility] Patient Information 2015 [place], [place]. This information is not intended to replace advice given to you by your health care provider.  Make sure you discuss any questions you have with your health care provider.

## 2014-07-25 NOTE — Progress Notes (Signed)
   Subjective:    Patient ID: Michelle Leon, female    DOB: 07/16/1980, 34 y.o.   MRN: [id number]  Cough Associated symptoms include wheezing. Pertinent negatives include no ear pain, postnasal drip, rhinorrhea, sore throat or shortness of breath.   34 y/o female presents with c/o worsening cough x 4 days. Has tried OTC Cold and cough musinex with some relief. Cough is worse with lying down. No relieving factors.      Review of Systems  Constitutional: Negative.   HENT: Positive for congestion (chest ). Negative for ear pain, postnasal drip, rhinorrhea, sinus pressure, sneezing and sore throat.   Eyes: Negative.   Respiratory: Positive for cough (worse at night, productive at times ) and wheezing. Negative for shortness of breath.   Cardiovascular: Negative.   All other systems reviewed and are negative.      Objective:   Physical Exam  Constitutional: She appears well-developed and well-nourished. No distress.  HENT:  Head: Normocephalic.  Posterior pharynx erythema   Cardiovascular: Normal rate, regular rhythm and normal heart sounds.  Exam reveals no gallop and no friction rub.   No murmur heard. Pulmonary/Chest: Effort normal. No respiratory distress. She has wheezes (inspiratory and expiratory in bilateral upper lobes posterior and anterior auscultaiton ).  Skin: She is not diaphoretic.  Nursing note and vitals reviewed.         Assessment & Plan:  1. Wheeze  - DG Chest 2 View - predniSONE (STERAPRED UNI-PAK 21 TAB) 10 MG (21) TBPK tablet; Take as directed  Dispense: 21 tablet; Refill: 0 - albuterol (PROVENTIL HFA;VENTOLIN HFA) 108 (90 BASE) MCG/ACT inhaler; Inhale 2 puffs into the lungs every 6 (six) hours as needed for wheezing or shortness of breath.  Dispense: 1 Inhaler; Refill: 0  2. Acute bronchitis, unspecified organism  - predniSONE (STERAPRED UNI-PAK 21 TAB) 10 MG (21) TBPK tablet; Take as directed  Dispense: 21 tablet; Refill: 0 - azithromycin  (ZITHROMAX) 250 MG tablet; Take 2 tablets PO on day 1, then 1 tablet on day 2-5  Dispense: 6 tablet; Refill: 0 - albuterol (PROVENTIL HFA;VENTOLIN HFA) 108 (90 BASE) MCG/ACT inhaler; Inhale 2 puffs into the lungs every 6 (six) hours as needed for wheezing or shortness of breath.  Dispense: 1 Inhaler; Refill: 0    RTO 1 week for recheck   Cheron Coryell A. Chauncey ReadingGann PA-C

## 2014-08-02 ENCOUNTER — Encounter: Payer: Self-pay | Admitting: Physician Assistant

## 2014-08-02 ENCOUNTER — Ambulatory Visit (INDEPENDENT_AMBULATORY_CARE_PROVIDER_SITE_OTHER): Payer: BLUE CROSS/BLUE SHIELD | Admitting: Physician Assistant

## 2014-08-02 VITALS — BP 107/73 | HR 65 | Temp 97.8°F | Ht 59.0 in | Wt 174.0 lb

## 2014-08-02 DIAGNOSIS — R062 Wheezing: Secondary | ICD-10-CM

## 2014-08-02 NOTE — Progress Notes (Signed)
   Subjective:    Patient ID: Michelle Leon, female    DOB: 08/12/1980, 34 y.o.   MRN: [id number]  HPI 34 y/o female presents for f/u of bronchitis. She states that she feels much better. Still using the albuterol occasionally - 2 times in the past 3 days. Wheeze, SOB seems worse at night. Nonproductive cough.    Review of Systems  Constitutional: Negative.   HENT: Negative.  Negative for congestion.   Respiratory: Positive for cough (nonproductive ), shortness of breath (occasional, associated with walking long amounts ) and wheezing.   Cardiovascular: Negative.   All other systems reviewed and are negative.      Objective:   Physical Exam  Constitutional: She is oriented to person, place, and time. She appears well-developed and well-nourished. No distress.  HENT:  Head: Normocephalic and atraumatic.  Cardiovascular: Normal rate, regular rhythm and normal heart sounds.  Exam reveals no gallop and no friction rub.   No murmur heard. Pulmonary/Chest: Effort normal and breath sounds normal. No respiratory distress. She has no wheezes. She has no rales. She exhibits no tenderness.  Neurological: She is alert and oriented to person, place, and time.  Skin: She is not diaphoretic.  Psychiatric: She has a normal mood and affect. Her behavior is normal. Judgment and thought content normal.  Nursing note and vitals reviewed.         Assessment & Plan:  1. Wheeze -Resolved. May continue albuterol prn  2. Bronchitis - Resolved   RTC prn   Kiasia Chou A. Chauncey ReadingGann PA-C

## 2015-05-08 ENCOUNTER — Ambulatory Visit (INDEPENDENT_AMBULATORY_CARE_PROVIDER_SITE_OTHER): Payer: BLUE CROSS/BLUE SHIELD | Admitting: Family

## 2015-05-08 ENCOUNTER — Encounter: Payer: Self-pay | Admitting: Family

## 2015-05-08 VITALS — BP 102/73 | HR 62 | Temp 97.2°F | Ht 59.0 in | Wt 161.0 lb

## 2015-05-08 DIAGNOSIS — R05 Cough: Secondary | ICD-10-CM | POA: Diagnosis not present

## 2015-05-08 DIAGNOSIS — R6889 Other general symptoms and signs: Secondary | ICD-10-CM | POA: Diagnosis not present

## 2015-05-08 DIAGNOSIS — R059 Cough, unspecified: Secondary | ICD-10-CM

## 2015-05-08 LAB — POCT INFLUENZA A/B
Influenza A, POC: NEGATIVE
Influenza B, POC: NEGATIVE

## 2015-05-08 MED ORDER — HYDROCODONE-HOMATROPINE 5-1.5 MG/5ML PO SYRP: 5.0000 mL | ORAL_SOLUTION | Freq: Three times a day (TID) | ORAL | Status: DC | PRN

## 2015-05-08 MED ORDER — FLUTICASONE PROPIONATE 50 MCG/ACT NA SUSP: 2.0000 | Freq: Every day | NASAL | Status: AC

## 2015-05-08 MED ORDER — BENZONATATE 200 MG PO CAPS: 200.0000 mg | ORAL_CAPSULE | Freq: Three times a day (TID) | ORAL | Status: DC | PRN

## 2015-05-08 NOTE — Patient Instructions (Addendum)
Influenza, Adult Influenza ("the flu") is a viral infection of the respiratory tract. It occurs more often in winter months because people spend more time in close contact with one another. Influenza can make you feel very sick. Influenza easily spreads from person to person (contagious). CAUSES  Influenza is caused by a virus that infects the respiratory tract. You can catch the virus by breathing in droplets from an infected person's cough or sneeze. You can also catch the virus by touching something that was recently contaminated with the virus and then touching your mouth, nose, or eyes. RISKS AND COMPLICATIONS You may be at risk for a more severe case of influenza if you smoke cigarettes, have diabetes, have chronic heart disease (such as heart failure) or lung disease (such as asthma), or if you have a weakened immune system. Elderly people and pregnant women are also at risk for more serious infections. The most common problem of influenza is a lung infection (pneumonia). Sometimes, this problem can require emergency medical care and may be life threatening. SIGNS AND SYMPTOMS  Symptoms typically last 4 to 10 days and may include:  Fever.  Chills.  Headache, body aches, and muscle aches.  Sore throat.  Chest discomfort and cough.  Poor appetite.  Weakness or feeling tired.  Dizziness.  Nausea or vomiting. DIAGNOSIS  Diagnosis of influenza is often made based on your history and a physical exam. A nose or throat swab test can be done to confirm the diagnosis. TREATMENT  In mild cases, influenza goes away on its own. Treatment is directed at relieving symptoms. For more severe cases, your health care provider may prescribe antiviral medicines to shorten the sickness. Antibiotic medicines are not effective because the infection is caused by a virus, not by bacteria. HOME CARE INSTRUCTIONS  Take medicines only as directed by your health care provider.  Use a cool mist humidifier  to make breathing easier.  Get plenty of rest until your temperature returns to normal. This usually takes 3 to 4 days.  Drink enough fluid to keep your urine clear or pale yellow.  Cover yourmouth and nosewhen coughing or sneezing,and wash your handswellto prevent thevirusfrom spreading.  Stay homefromwork orschool untilthe fever is gonefor at least 1full day. PREVENTION  An annual influenza vaccination (flu shot) is the best way to avoid getting influenza. An annual flu shot is now routinely recommended for all adults in the [place] SEEK MEDICAL CARE IF:  You experiencechest pain, yourcough worsens,or you producemore mucus.  Youhave nausea,vomiting, ordiarrhea.  Your fever returns or gets worse. SEEK IMMEDIATE MEDICAL CARE IF:  You havetrouble breathing, you become short of breath,or your skin ornails becomebluish.  You have severe painor stiffnessin the neck.  You develop a sudden headache, or pain in the face or ear.  You have nausea or vomiting that you cannot control. MAKE SURE YOU:   Understand these instructions.  Will watch your condition.  Will get help right away if you are not doing well or get worse.   This information is not intended to replace advice given to you by your health care provider. Make sure you discuss any questions you have with your health care provider.   Document Released: 02/22/2000 Document Revised: 03/17/2014 Document Reviewed: 05/26/2011 Elsevier Interactive Patient Education 2016 [employer]  - Take meds as prescribed - Use a cool mist humidifier  -Use saline nose sprays frequently -Saline irrigations of the nose can be very helpful if done frequently.  * 4X daily for   1 week*  * Use of a nettie pot can be helpful with this. Follow directions with this* -Force fluids -For any cough or congestion  Use plain Mucinex- regular strength or max strength is fine   * Children- consult with Pharmacist for  dosing -For fever or aces or pains- take tylenol or ibuprofen appropriate for age and weight.  * for fevers greater than 101 orally you may alternate ibuprofen and tylenol every  3 hours. -Throat lozenges if help    Hong Moring, FNP   

## 2015-05-08 NOTE — Progress Notes (Signed)
   Subjective:    Patient ID: Michelle Leon, female    DOB: 10-Feb-1981, 35 y.o.   MRN: [id number]  Pt presents to the office today with cough. Pt states this started about 8 days ago. Pt reports her husband was diagnosed with the flu 2 days ago.  Cough The current episode started 1 to 4 weeks ago. The problem has been gradually worsening. The problem occurs every few minutes. The cough is productive of sputum. Associated symptoms include chills, headaches, myalgias, a rash, rhinorrhea, a sore throat, shortness of breath and wheezing. Pertinent negatives include no ear congestion, ear pain, fever or nasal congestion. The symptoms are aggravated by lying down. She has tried OTC cough suppressant and rest (Mucinex) for the symptoms. The treatment provided mild relief. There is no history of asthma, COPD or environmental allergies.      Review of Systems  Constitutional: Positive for chills. Negative for fever.  HENT: Positive for rhinorrhea and sore throat. Negative for ear pain.   Eyes: Negative.   Respiratory: Positive for cough, shortness of breath and wheezing.   Cardiovascular: Negative.  Negative for palpitations.  Gastrointestinal: Negative.   Endocrine: Negative.   Genitourinary: Negative.   Musculoskeletal: Positive for myalgias.  Skin: Positive for rash.  Allergic/Immunologic: Negative for environmental allergies.  Neurological: Positive for headaches.  Hematological: Negative.   Psychiatric/Behavioral: Negative.   All other systems reviewed and are negative.      Objective:   Physical Exam  Constitutional: She is oriented to person, place, and time. She appears well-developed and well-nourished. No distress.  HENT:  Head: Normocephalic and atraumatic.  Right Ear: External ear normal.  Left Ear: External ear normal.  Mouth/Throat: Oropharynx is clear and moist.  Eyes: Pupils are equal, round, and reactive to light.  Neck: Normal range of motion. Neck supple. No  thyromegaly present.  Cardiovascular: Normal rate, regular rhythm, normal heart sounds and intact distal pulses.   No murmur heard. Pulmonary/Chest: Effort normal and breath sounds normal. No respiratory distress. She has no wheezes.  Abdominal: Soft. Bowel sounds are normal. She exhibits no distension. There is no tenderness.  Musculoskeletal: Normal range of motion. She exhibits no edema or tenderness.  Neurological: She is alert and oriented to person, place, and time. She has normal reflexes. No cranial nerve deficit.  Skin: Skin is warm and dry.  Psychiatric: She has a normal mood and affect. Her behavior is normal. Judgment and thought content normal.  Vitals reviewed.     BP 102/73 mmHg  Pulse 62  Temp(Src) 97.2 F (36.2 C) (Oral)  Ht  (1.499 m)  Wt 161 lb (73.029 kg)  BMI 32.50 kg/m2     Assessment & Plan:  1. Flu-like symptoms - POCT Influenza A/B  2. Cough - fluticasone (FLONASE) 50 MCG/ACT nasal spray; Place 2 sprays into both nostrils daily.  Dispense: 16 g; Refill: 6 - benzonatate (TESSALON) 200 MG capsule; Take 1 capsule (200 mg total) by mouth 3 (three) times daily as needed.  Dispense: 30 capsule; Refill: 1 - HYDROcodone-homatropine (HYCODAN) 5-1.5 MG/5ML syrup; Take 5 mLs by mouth every 8 (eight) hours as needed for cough.  Dispense: 120 mL; Refill: 0 Rest  Force fluids Good hand hygiene discussed RTO prn   Jannifer Rodney, FNP

## 2016-01-14 ENCOUNTER — Ambulatory Visit (INDEPENDENT_AMBULATORY_CARE_PROVIDER_SITE_OTHER): Payer: BLUE CROSS/BLUE SHIELD | Admitting: Pediatrics

## 2016-01-14 ENCOUNTER — Encounter: Payer: Self-pay | Admitting: Pediatrics

## 2016-01-14 VITALS — BP 105/75 | HR 92 | Temp 98.2°F | Ht 59.0 in | Wt 132.4 lb

## 2016-01-14 DIAGNOSIS — Z6826 Body mass index (BMI) 26.0-26.9, adult: Secondary | ICD-10-CM | POA: Diagnosis not present

## 2016-01-14 DIAGNOSIS — K469 Unspecified abdominal hernia without obstruction or gangrene: Secondary | ICD-10-CM

## 2016-01-14 DIAGNOSIS — K5900 Constipation, unspecified: Secondary | ICD-10-CM | POA: Diagnosis not present

## 2016-01-14 NOTE — Patient Instructions (Signed)
Constipation--use miralax as needed for 1-2 time daily soft stools Can use over the counter fiber supplements as well Look for yogurt with active/live cultures

## 2016-01-14 NOTE — Progress Notes (Signed)
  Subjective:   Patient ID: Michelle Leon, female    DOB: 05/22/1980, 35 y.o.   MRN: [id number] CC: Abdominal Pain and Hernia  HPI: Michelle Leon is a [age] y.o. female presenting for Abdominal Pain and Hernia  Has pressure in the center of her abd when she walks for long periods Over past 5 months lost 40 lbs Doesn't hurt Notices when she sits up from lying down as well Twice has noticed spots of blood  S/p cholecystectomy in 2007 Last pregnancy 2009  Constipation ongoing for apprx last month Going apprx 1x a week at times No blood in stool  Having large, sometimes painful stools  Started phentermine 5 months ago Now not eating junk food, minimal carbs   Relevant past medical, surgical, family and social history reviewed. Allergies and medications reviewed and updated. History  Smoking Status  . Never Smoker  Smokeless Tobacco  . Never Used   ROS: Per HPI   Objective:    BP 105/75   Pulse 92   Temp 98.2 F (36.8 C) (Oral)   Ht 4\' 11"  (1.499 m)   Wt 132 lb 6.4 oz (60.1 kg)   BMI 26.74 kg/m   Wt Readings from Last 3 Encounters:  01/14/16 132 lb 6.4 oz (60.1 kg)  05/08/15 161 lb (73 kg)  08/02/14 174 lb (78.9 kg)    Gen: NAD, alert, cooperative with exam, NCAT EYES: EOMI, no conjunctival injection, or no icterus CV: NRRR, normal S1/S2, no murmur, distal pulses 2+ b/l Resp: CTABL, no wheezes, normal WOB Abd: +BS, soft, NTND. Upper abdominal hernia present during sit up, no umbilical hernia, easily reducible, no guarding or organomegaly Ext: No edema, warm Neuro: Alert and oriented, strength equal b/l UE and LE, coordination grossly normal MSK: normal muscle bulk  Assessment & Plan:  Michelle Leon was seen today for abdominal pain and hernia.  Diagnoses and all orders for this visit:  Abdominal hernia without obstruction and without gangrene, recurrence not specified, unspecified hernia type Bothering her cosmetically Trial abd strengthening exercises, noticing it  more since recent 40 lb weight loss  Constipation, unspecified constipation type Discussed miralax use, water intake  BMI 26.0-26.9,adult Cont lifestyle changes, has been doing great  Follow up plan: Return 4 weeks as needed. Rex Krasarol Gwenivere Hiraldo, MD [facility]

## 2016-02-11 ENCOUNTER — Ambulatory Visit (INDEPENDENT_AMBULATORY_CARE_PROVIDER_SITE_OTHER): Payer: BLUE CROSS/BLUE SHIELD | Admitting: Pediatrics

## 2016-02-11 ENCOUNTER — Encounter: Payer: Self-pay | Admitting: Pediatrics

## 2016-02-11 VITALS — BP 100/69 | HR 87 | Temp 98.7°F | Ht 59.0 in | Wt 132.0 lb

## 2016-02-11 DIAGNOSIS — K429 Umbilical hernia without obstruction or gangrene: Secondary | ICD-10-CM

## 2016-02-11 DIAGNOSIS — Z6826 Body mass index (BMI) 26.0-26.9, adult: Secondary | ICD-10-CM | POA: Diagnosis not present

## 2016-02-11 NOTE — Progress Notes (Signed)
  Subjective:   Patient ID: Michelle Leon, female    DOB: 01/29/1981, 35 y.o.   MRN: [id number] CC: Follow-up hernia HPI: Michelle Leon is a 35 y.o. female presenting for Follow-up  Umbilical hernia now sometimes sore after being on her feet throughout the day. Bothers her getting up from sitting Doing regular abd exercise Started bothering her after 40 lb weight loss over past few months on current diet and on phentermine  Constipation: when takes miralax daily has regular bowel movements If she stops, has harder stools, must strain  BMI elevated: has been on phentermine for 5 mo Following with nutrition  Pap smear: scheduled dec 28th  Relevant past medical, surgical, family and social history reviewed. Allergies and medications reviewed and updated. History  Smoking Status  . Never Smoker  Smokeless Tobacco  . Never Used   ROS: Per HPI   Objective:    BP 100/69   Pulse 87   Temp 98.7 F (37.1 C) (Oral)   Ht 4\' 11"  (1.499 m)   Wt 132 lb (59.9 kg)   BMI 26.66 kg/m   Wt Readings from Last 3 Encounters:  02/11/16 132 lb (59.9 kg)  01/14/16 132 lb 6.4 oz (60.1 kg)  05/08/15 161 lb (73 kg)    Gen: NAD, alert, cooperative with exam, NCAT EYES: EOMI, no conjunctival injection, or no icterus CV: NRRR, normal S1/S2, no murmur Resp: CTABL, no wheezes, normal WOB Abd: +BS, soft, NT, midline ventral hernia, over 10 cm between rectus abd muscles. no guarding or organomegaly Ext: No edema, warm Neuro: Alert and oriented, strength equal b/l UE and LE, coordination grossly normal MSK: normal muscle bulk  Assessment & Plan:  [name] was seen today for follow-up hernia  Diagnoses and all orders for this visit:  Umbilical hernia without obstruction and without gangrene Ongoing symptoms of soreness after being on feet, easily reducible, interested in possible repair Will refer to surgery  -     Ambulatory referral to General Surgery  BMI 26.0-26.9,adult Cont lifestyle  changes, seeing nutritionist regularly who is also prescribing phentermine  Has pap smear scheduled later this month  Follow up plan: As needed Rex Krasarol Jania Steinke, MD [facility]

## 2016-02-22 ENCOUNTER — Other Ambulatory Visit: Payer: Self-pay | Admitting: Surgery

## 2016-02-22 ENCOUNTER — Ambulatory Visit: Payer: Self-pay | Admitting: Surgery

## 2016-02-22 DIAGNOSIS — R1900 Intra-abdominal and pelvic swelling, mass and lump, unspecified site: Secondary | ICD-10-CM

## 2016-02-22 DIAGNOSIS — K5900 Constipation, unspecified: Secondary | ICD-10-CM | POA: Diagnosis not present

## 2016-02-22 DIAGNOSIS — M6208 Separation of muscle (nontraumatic), other site: Secondary | ICD-10-CM | POA: Diagnosis not present

## 2016-02-26 DIAGNOSIS — N911 Secondary amenorrhea: Secondary | ICD-10-CM | POA: Diagnosis not present

## 2016-02-27 ENCOUNTER — Other Ambulatory Visit: Payer: Self-pay

## 2016-02-28 DIAGNOSIS — Z3480 Encounter for supervision of other normal pregnancy, unspecified trimester: Secondary | ICD-10-CM | POA: Diagnosis not present

## 2016-02-28 DIAGNOSIS — Z3481 Encounter for supervision of other normal pregnancy, first trimester: Secondary | ICD-10-CM | POA: Diagnosis not present

## 2016-02-28 DIAGNOSIS — Z3685 Encounter for antenatal screening for Streptococcus B: Secondary | ICD-10-CM | POA: Diagnosis not present

## 2016-02-28 LAB — OB RESULTS CONSOLE GC/CHLAMYDIA
CHLAMYDIA, DNA PROBE: NEGATIVE
Gonorrhea: NEGATIVE

## 2016-02-28 LAB — OB RESULTS CONSOLE ANTIBODY SCREEN: Antibody Screen: NEGATIVE

## 2016-02-28 LAB — OB RESULTS CONSOLE ABO/RH: RH Type: POSITIVE

## 2016-02-28 LAB — OB RESULTS CONSOLE HEPATITIS B SURFACE ANTIGEN: HEP B S AG: NEGATIVE

## 2016-02-28 LAB — OB RESULTS CONSOLE HIV ANTIBODY (ROUTINE TESTING): HIV: NONREACTIVE

## 2016-02-28 LAB — OB RESULTS CONSOLE RUBELLA ANTIBODY, IGM: RUBELLA: IMMUNE

## 2016-02-28 LAB — OB RESULTS CONSOLE RPR: RPR: NONREACTIVE

## 2016-03-04 DIAGNOSIS — Z113 Encounter for screening for infections with a predominantly sexual mode of transmission: Secondary | ICD-10-CM | POA: Diagnosis not present

## 2016-03-04 DIAGNOSIS — Z3A27 27 weeks gestation of pregnancy: Secondary | ICD-10-CM | POA: Diagnosis not present

## 2016-03-04 DIAGNOSIS — Z3480 Encounter for supervision of other normal pregnancy, unspecified trimester: Secondary | ICD-10-CM | POA: Diagnosis not present

## 2016-03-04 DIAGNOSIS — Z362 Encounter for other antenatal screening follow-up: Secondary | ICD-10-CM | POA: Diagnosis not present

## 2016-03-04 DIAGNOSIS — Z3401 Encounter for supervision of normal first pregnancy, first trimester: Secondary | ICD-10-CM | POA: Diagnosis not present

## 2016-03-20 DIAGNOSIS — Z3A29 29 weeks gestation of pregnancy: Secondary | ICD-10-CM | POA: Diagnosis not present

## 2016-03-20 DIAGNOSIS — Z3480 Encounter for supervision of other normal pregnancy, unspecified trimester: Secondary | ICD-10-CM | POA: Diagnosis not present

## 2016-03-20 DIAGNOSIS — Z23 Encounter for immunization: Secondary | ICD-10-CM | POA: Diagnosis not present

## 2016-05-01 DIAGNOSIS — Z3480 Encounter for supervision of other normal pregnancy, unspecified trimester: Secondary | ICD-10-CM | POA: Diagnosis not present

## 2016-05-01 DIAGNOSIS — Z3A35 35 weeks gestation of pregnancy: Secondary | ICD-10-CM | POA: Diagnosis not present

## 2016-05-01 DIAGNOSIS — Z3685 Encounter for antenatal screening for Streptococcus B: Secondary | ICD-10-CM | POA: Diagnosis not present

## 2016-05-19 NOTE — H&P (Addendum)
Michelle Leon is a 36 y.o. female presenting for repeat c-section. OB History    No data available     Past Medical History:  Diagnosis Date  . MRSA (methicillin resistant Staphylococcus aureus)    Past Surgical History:  Procedure Laterality Date  . CESAREAN SECTION    . CHOLECYSTECTOMY     Family History: family history includes Hyperlipidemia in her other; Hypertension in her other. Social History:  reports that she has never smoked. She has never used smokeless tobacco. She reports that she does not drink alcohol or use drugs.     Maternal Diabetes: No Genetic Screening: Declined Maternal Ultrasounds/Referrals: Normal Fetal Ultrasounds or other Referrals:  None Maternal Substance Abuse:  No Significant Maternal Medications:  None Significant Maternal Lab Results:  None Other Comments:  None  ROS History   There were no vitals taken for this visit. Exam Physical Exam  Prenatal labs: ABO, Rh:  AB+ Antibody:  neg Rubella:   RPR:   NR HBsAg:   neg  HIV:   neg GBS:   neg  Assessment/Plan: Previous c-section, for repeat  r/b/a discussed, questions answered, informed consent  Viha Kriegel 05/19/2016, 1:42 PM

## 2016-05-20 ENCOUNTER — Encounter (HOSPITAL_COMMUNITY): Payer: Self-pay | Admitting: *Deleted

## 2016-05-22 ENCOUNTER — Telehealth (HOSPITAL_COMMUNITY): Payer: Self-pay | Admitting: *Deleted

## 2016-05-22 NOTE — Telephone Encounter (Signed)
Preadmission screen  

## 2016-05-23 ENCOUNTER — Encounter (HOSPITAL_COMMUNITY): Payer: Self-pay

## 2016-05-26 ENCOUNTER — Encounter (HOSPITAL_COMMUNITY)
Admission: RE | Admit: 2016-05-26 | Discharge: 2016-05-26 | Disposition: A | Discharge status: Home / Self Care | Payer: BLUE CROSS/BLUE SHIELD | Source: Ambulatory Visit | Attending: Obstetrics and Gynecology | Admitting: Obstetrics and Gynecology

## 2016-05-26 DIAGNOSIS — Z8249 Family history of ischemic heart disease and other diseases of the circulatory system: Secondary | ICD-10-CM | POA: Diagnosis not present

## 2016-05-26 DIAGNOSIS — O34219 Maternal care for unspecified type scar from previous cesarean delivery: Secondary | ICD-10-CM | POA: Diagnosis not present

## 2016-05-26 DIAGNOSIS — Z3A39 39 weeks gestation of pregnancy: Secondary | ICD-10-CM | POA: Diagnosis not present

## 2016-05-26 DIAGNOSIS — Z3483 Encounter for supervision of other normal pregnancy, third trimester: Secondary | ICD-10-CM | POA: Diagnosis not present

## 2016-05-26 DIAGNOSIS — O34211 Maternal care for low transverse scar from previous cesarean delivery: Secondary | ICD-10-CM | POA: Diagnosis not present

## 2016-05-26 DIAGNOSIS — Z3482 Encounter for supervision of other normal pregnancy, second trimester: Secondary | ICD-10-CM | POA: Diagnosis not present

## 2016-05-26 HISTORY — DX: Polycystic ovarian syndrome: E28.2

## 2016-05-26 HISTORY — DX: Personal history of other infectious and parasitic diseases: Z86.19

## 2016-05-26 LAB — CBC
HCT: 35 % — ABNORMAL LOW (ref 36.0–46.0)
HEMOGLOBIN: 12.1 g/dL (ref 12.0–15.0)
MCH: 32.3 pg (ref 26.0–34.0)
MCHC: 34.6 g/dL (ref 30.0–36.0)
MCV: 93.3 fL (ref 78.0–100.0)
PLATELETS: 151 10*3/uL (ref 150–400)
RBC: 3.75 MIL/uL — AB (ref 3.87–5.11)
RDW: 12.5 % (ref 11.5–15.5)
WBC: 6.1 10*3/uL (ref 4.0–10.5)

## 2016-05-26 LAB — TYPE AND SCREEN
ABO/RH(D): AB POS
ANTIBODY SCREEN: NEGATIVE

## 2016-05-26 NOTE — Patient Instructions (Signed)
[name]  05/26/2016   Your procedure is scheduled on:  05/27/2016  [facility]  Pick up the phone at the desk and dial [phone].   Call this number if you have problems the morning of surgery: [phone]   Remember:   Do not eat food:After Midnight.  Do not drink clear liquids: After Midnight.  Take these medicines the morning of surgery with A SIP OF WATER: none   Do not wear jewelry, make-up or nail polish.  Do not wear lotions, powders, or perfumes. Do not wear deodorant.  Do not shave 48 hours prior to surgery.  Do not bring valuables to the hospital.  [facility] is not   responsible for any belongings or valuables brought to the hospital.  Contacts, dentures or bridgework may not be worn into surgery.  Leave suitcase in the car. After surgery it may be brought to your room.  For patients admitted to the hospital, checkout time is 11:00 AM the day of              discharge.   Patients discharged the day of surgery will not be allowed to drive             home.  Name and phone number of your driver: na  Special Instructions:   N/A   Please read over the following fact sheets that you were given:   Surgical Site Infection Prevention

## 2016-05-27 ENCOUNTER — Inpatient Hospital Stay (HOSPITAL_COMMUNITY): Payer: BLUE CROSS/BLUE SHIELD | Admitting: Anesthesiology

## 2016-05-27 ENCOUNTER — Inpatient Hospital Stay (HOSPITAL_COMMUNITY)
Admission: RE | Admit: 2016-05-27 | Discharge: 2016-05-29 | DRG: 766 | Disposition: A | Discharge status: Home / Self Care | Payer: BLUE CROSS/BLUE SHIELD | Source: Ambulatory Visit | Attending: Obstetrics and Gynecology | Admitting: Obstetrics and Gynecology

## 2016-05-27 ENCOUNTER — Encounter (HOSPITAL_COMMUNITY)
Admission: RE | Disposition: A | Discharge status: Home / Self Care | Payer: Self-pay | Source: Ambulatory Visit | Attending: Obstetrics and Gynecology

## 2016-05-27 ENCOUNTER — Encounter (HOSPITAL_COMMUNITY): Payer: Self-pay | Admitting: Anesthesiology

## 2016-05-27 DIAGNOSIS — Z8249 Family history of ischemic heart disease and other diseases of the circulatory system: Secondary | ICD-10-CM

## 2016-05-27 DIAGNOSIS — Z3482 Encounter for supervision of other normal pregnancy, second trimester: Secondary | ICD-10-CM | POA: Diagnosis not present

## 2016-05-27 DIAGNOSIS — Z3483 Encounter for supervision of other normal pregnancy, third trimester: Secondary | ICD-10-CM | POA: Diagnosis not present

## 2016-05-27 DIAGNOSIS — Z98891 History of uterine scar from previous surgery: Secondary | ICD-10-CM

## 2016-05-27 DIAGNOSIS — Z3A39 39 weeks gestation of pregnancy: Secondary | ICD-10-CM

## 2016-05-27 DIAGNOSIS — O34211 Maternal care for low transverse scar from previous cesarean delivery: Secondary | ICD-10-CM | POA: Diagnosis not present

## 2016-05-27 DIAGNOSIS — O34219 Maternal care for unspecified type scar from previous cesarean delivery: Secondary | ICD-10-CM | POA: Diagnosis not present

## 2016-05-27 LAB — RPR: RPR Ser Ql: NONREACTIVE

## 2016-05-27 SURGERY — Surgical Case
Anesthesia: Spinal

## 2016-05-27 MED ORDER — FENTANYL CITRATE (PF) 100 MCG/2ML IJ SOLN: 25.0000 ug | INTRAMUSCULAR | Status: DC | PRN

## 2016-05-27 MED ORDER — OXYCODONE-ACETAMINOPHEN 5-325 MG PO TABS: 2.0000 | ORAL_TABLET | ORAL | Status: DC | PRN

## 2016-05-27 MED ORDER — MENTHOL 3 MG MT LOZG: 1.0000 | LOZENGE | OROMUCOSAL | Status: DC | PRN

## 2016-05-27 MED ORDER — MORPHINE SULFATE (PF) 0.5 MG/ML IJ SOLN
INTRAMUSCULAR | Status: AC
  Filled 2016-05-27: qty 10

## 2016-05-27 MED ORDER — MEASLES, MUMPS & RUBELLA VAC ~~LOC~~ INJ: 0.5000 mL | INJECTION | Freq: Once | SUBCUTANEOUS | Status: DC

## 2016-05-27 MED ORDER — DIPHENHYDRAMINE HCL 50 MG/ML IJ SOLN: 12.5000 mg | INTRAMUSCULAR | Status: DC | PRN

## 2016-05-27 MED ORDER — KETOROLAC TROMETHAMINE 30 MG/ML IJ SOLN: 30.0000 mg | Freq: Four times a day (QID) | INTRAMUSCULAR | Status: AC | PRN

## 2016-05-27 MED ORDER — FENTANYL CITRATE (PF) 100 MCG/2ML IJ SOLN
INTRAMUSCULAR | Status: AC
  Filled 2016-05-27: qty 2

## 2016-05-27 MED ORDER — SODIUM CHLORIDE 0.9% FLUSH: 3.0000 mL | INTRAVENOUS | Status: DC | PRN

## 2016-05-27 MED ORDER — COCONUT OIL OIL
1.0000 "application " | TOPICAL_OIL | Status: DC | PRN
  Administered 2016-05-28: 1 via TOPICAL
  Filled 2016-05-27: qty 120

## 2016-05-27 MED ORDER — LACTATED RINGERS IV SOLN: INTRAVENOUS | Status: DC

## 2016-05-27 MED ORDER — SODIUM CHLORIDE 0.9 % IR SOLN
Status: DC | PRN
  Administered 2016-05-27: 1000 mL

## 2016-05-27 MED ORDER — PHENYLEPHRINE 8 MG IN D5W 100 ML (0.08MG/ML) PREMIX OPTIME
INJECTION | INTRAVENOUS | Status: AC
  Filled 2016-05-27: qty 100

## 2016-05-27 MED ORDER — OXYCODONE-ACETAMINOPHEN 5-325 MG PO TABS
1.0000 | ORAL_TABLET | ORAL | Status: DC | PRN
Start: 2016-05-27 — End: 2016-05-29
  Administered 2016-05-28 – 2016-05-29 (×3): 1 via ORAL
  Filled 2016-05-27 (×3): qty 1

## 2016-05-27 MED ORDER — SENNOSIDES-DOCUSATE SODIUM 8.6-50 MG PO TABS
2.0000 | ORAL_TABLET | ORAL | Status: DC
  Administered 2016-05-27 – 2016-05-29 (×2): 2 via ORAL
  Filled 2016-05-27 (×2): qty 2

## 2016-05-27 MED ORDER — NALOXONE HCL 2 MG/2ML IJ SOSY: 1.0000 ug/kg/h | PREFILLED_SYRINGE | INTRAVENOUS | Status: DC | PRN

## 2016-05-27 MED ORDER — SIMETHICONE 80 MG PO CHEW
80.0000 mg | CHEWABLE_TABLET | Freq: Three times a day (TID) | ORAL | Status: DC
  Administered 2016-05-28 – 2016-05-29 (×4): 80 mg via ORAL
  Filled 2016-05-27 (×4): qty 1

## 2016-05-27 MED ORDER — NALBUPHINE HCL 10 MG/ML IJ SOLN: 5.0000 mg | Freq: Once | INTRAMUSCULAR | Status: DC | PRN

## 2016-05-27 MED ORDER — ONDANSETRON HCL 4 MG/2ML IJ SOLN
INTRAMUSCULAR | Status: DC | PRN
  Administered 2016-05-27: 4 mg via INTRAVENOUS

## 2016-05-27 MED ORDER — PRENATAL MULTIVITAMIN CH: 1.0000 | ORAL_TABLET | Freq: Every day | ORAL | Status: DC

## 2016-05-27 MED ORDER — DIBUCAINE 1 % RE OINT: 1.0000 "application " | TOPICAL_OINTMENT | RECTAL | Status: DC | PRN

## 2016-05-27 MED ORDER — SIMETHICONE 80 MG PO CHEW
80.0000 mg | CHEWABLE_TABLET | ORAL | Status: DC
  Administered 2016-05-27 – 2016-05-29 (×2): 80 mg via ORAL
  Filled 2016-05-27 (×2): qty 1

## 2016-05-27 MED ORDER — PHENYLEPHRINE 40 MCG/ML (10ML) SYRINGE FOR IV PUSH (FOR BLOOD PRESSURE SUPPORT)
PREFILLED_SYRINGE | INTRAVENOUS | Status: AC
  Filled 2016-05-27: qty 10

## 2016-05-27 MED ORDER — SOD CITRATE-CITRIC ACID 500-334 MG/5ML PO SOLN
30.0000 mL | Freq: Once | ORAL | Status: AC
  Administered 2016-05-27: 30 mL via ORAL
  Filled 2016-05-27: qty 15

## 2016-05-27 MED ORDER — KETOROLAC TROMETHAMINE 30 MG/ML IJ SOLN
30.0000 mg | Freq: Four times a day (QID) | INTRAMUSCULAR | Status: AC | PRN
  Administered 2016-05-27: 30 mg via INTRAMUSCULAR

## 2016-05-27 MED ORDER — NALBUPHINE HCL 10 MG/ML IJ SOLN: 5.0000 mg | INTRAMUSCULAR | Status: DC | PRN

## 2016-05-27 MED ORDER — MEPERIDINE HCL 25 MG/ML IJ SOLN: 6.2500 mg | INTRAMUSCULAR | Status: DC | PRN

## 2016-05-27 MED ORDER — LACTATED RINGERS IV SOLN
INTRAVENOUS | Status: DC | PRN
  Administered 2016-05-27: 13:00:00 via INTRAVENOUS

## 2016-05-27 MED ORDER — FENTANYL CITRATE (PF) 100 MCG/2ML IJ SOLN
INTRAMUSCULAR | Status: DC | PRN
  Administered 2016-05-27: 10 ug via INTRATHECAL

## 2016-05-27 MED ORDER — PRENATAL MULTIVITAMIN CH
1.0000 | ORAL_TABLET | Freq: Every day | ORAL | Status: DC
  Administered 2016-05-28 – 2016-05-29 (×2): 1 via ORAL
  Filled 2016-05-27 (×2): qty 1

## 2016-05-27 MED ORDER — DEXTROSE IN LACTATED RINGERS 5 % IV SOLN
INTRAVENOUS | Status: DC
  Administered 2016-05-27: 19:00:00 via INTRAVENOUS

## 2016-05-27 MED ORDER — DIPHENHYDRAMINE HCL 25 MG PO CAPS
25.0000 mg | ORAL_CAPSULE | ORAL | Status: DC | PRN
Start: 2016-05-27 — End: 2016-05-29

## 2016-05-27 MED ORDER — ONDANSETRON HCL 4 MG/2ML IJ SOLN: 4.0000 mg | Freq: Three times a day (TID) | INTRAMUSCULAR | Status: DC | PRN

## 2016-05-27 MED ORDER — SCOPOLAMINE 1 MG/3DAYS TD PT72
1.0000 | MEDICATED_PATCH | Freq: Once | TRANSDERMAL | Status: DC
  Administered 2016-05-27: 1.5 mg via TRANSDERMAL
  Filled 2016-05-27: qty 1

## 2016-05-27 MED ORDER — MEDROXYPROGESTERONE ACETATE 150 MG/ML IM SUSP: 150.0000 mg | INTRAMUSCULAR | Status: DC | PRN

## 2016-05-27 MED ORDER — WITCH HAZEL-GLYCERIN EX PADS: 1.0000 "application " | MEDICATED_PAD | CUTANEOUS | Status: DC | PRN

## 2016-05-27 MED ORDER — PHENYLEPHRINE 8 MG IN D5W 100 ML (0.08MG/ML) PREMIX OPTIME
INJECTION | INTRAVENOUS | Status: DC | PRN
  Administered 2016-05-27: 60 ug/min via INTRAVENOUS

## 2016-05-27 MED ORDER — OXYTOCIN 10 UNIT/ML IJ SOLN
INTRAMUSCULAR | Status: AC
  Filled 2016-05-27: qty 4

## 2016-05-27 MED ORDER — IBUPROFEN 600 MG PO TABS
600.0000 mg | ORAL_TABLET | Freq: Four times a day (QID) | ORAL | Status: DC
  Administered 2016-05-27 – 2016-05-29 (×7): 600 mg via ORAL
  Filled 2016-05-27 (×7): qty 1

## 2016-05-27 MED ORDER — DIPHENHYDRAMINE HCL 25 MG PO CAPS: 25.0000 mg | ORAL_CAPSULE | Freq: Four times a day (QID) | ORAL | Status: DC | PRN

## 2016-05-27 MED ORDER — OXYTOCIN 40 UNITS IN LACTATED RINGERS INFUSION - SIMPLE MED: 2.5000 [IU]/h | INTRAVENOUS | Status: AC

## 2016-05-27 MED ORDER — ACETAMINOPHEN 325 MG PO TABS
650.0000 mg | ORAL_TABLET | ORAL | Status: DC | PRN
  Administered 2016-05-27 – 2016-05-28 (×2): 650 mg via ORAL
  Filled 2016-05-27 (×2): qty 2

## 2016-05-27 MED ORDER — METOCLOPRAMIDE HCL 5 MG/ML IJ SOLN: 10.0000 mg | Freq: Once | INTRAMUSCULAR | Status: DC | PRN

## 2016-05-27 MED ORDER — ONDANSETRON HCL 4 MG/2ML IJ SOLN
INTRAMUSCULAR | Status: AC
  Filled 2016-05-27: qty 2

## 2016-05-27 MED ORDER — LACTATED RINGERS IV SOLN
INTRAVENOUS | Status: DC
Start: 2016-05-27 — End: 2016-05-27
  Administered 2016-05-27 (×2): 125 mL/h via INTRAVENOUS
  Administered 2016-05-27: 13:00:00 via INTRAVENOUS

## 2016-05-27 MED ORDER — SIMETHICONE 80 MG PO CHEW: 80.0000 mg | CHEWABLE_TABLET | ORAL | Status: DC | PRN

## 2016-05-27 MED ORDER — CEFAZOLIN SODIUM-DEXTROSE 2-4 GM/100ML-% IV SOLN
2.0000 g | INTRAVENOUS | Status: AC
  Administered 2016-05-27: 2 g via INTRAVENOUS
  Filled 2016-05-27: qty 100

## 2016-05-27 MED ORDER — KETOROLAC TROMETHAMINE 30 MG/ML IJ SOLN
INTRAMUSCULAR | Status: AC
  Administered 2016-05-27: 30 mg via INTRAMUSCULAR
  Filled 2016-05-27: qty 1

## 2016-05-27 MED ORDER — OXYTOCIN 40 UNITS IN LACTATED RINGERS INFUSION - SIMPLE MED
INTRAVENOUS | Status: DC | PRN
  Administered 2016-05-27: 40 [IU] via INTRAVENOUS

## 2016-05-27 MED ORDER — NALOXONE HCL 0.4 MG/ML IJ SOLN: 0.4000 mg | INTRAMUSCULAR | Status: DC | PRN

## 2016-05-27 MED ORDER — MORPHINE SULFATE (PF) 0.5 MG/ML IJ SOLN
INTRAMUSCULAR | Status: DC | PRN
  Administered 2016-05-27: .2 mg via INTRATHECAL

## 2016-05-27 MED ORDER — BUPIVACAINE IN DEXTROSE 0.75-8.25 % IT SOLN
INTRATHECAL | Status: DC | PRN
  Administered 2016-05-27: 1.2 mL via INTRATHECAL

## 2016-05-27 MED ORDER — TETANUS-DIPHTH-ACELL PERTUSSIS 5-2.5-18.5 LF-MCG/0.5 IM SUSP: 0.5000 mL | Freq: Once | INTRAMUSCULAR | Status: DC

## 2016-05-27 SURGICAL SUPPLY — 36 items
CHLORAPREP W/TINT 26ML (MISCELLANEOUS) ×3 IMPLANT
CLAMP CORD UMBIL (MISCELLANEOUS) IMPLANT
CLOTH BEACON ORANGE TIMEOUT ST (SAFETY) ×3 IMPLANT
DERMABOND ADHESIVE PROPEN (GAUZE/BANDAGES/DRESSINGS) ×2
DERMABOND ADVANCED (GAUZE/BANDAGES/DRESSINGS) ×2
DERMABOND ADVANCED .7 DNX12 (GAUZE/BANDAGES/DRESSINGS) ×1 IMPLANT
DERMABOND ADVANCED .7 DNX6 (GAUZE/BANDAGES/DRESSINGS) ×1 IMPLANT
DRSG OPSITE POSTOP 4X10 (GAUZE/BANDAGES/DRESSINGS) ×3 IMPLANT
ELECT REM PT RETURN 9FT ADLT (ELECTROSURGICAL) ×3
ELECTRODE REM PT RTRN 9FT ADLT (ELECTROSURGICAL) ×1 IMPLANT
EXTRACTOR VACUUM M CUP 4 TUBE (SUCTIONS) IMPLANT
EXTRACTOR VACUUM M CUP 4' TUBE (SUCTIONS)
GLOVE BIO SURGEON STRL SZ 6.5 (GLOVE) ×2 IMPLANT
GLOVE BIO SURGEONS STRL SZ 6.5 (GLOVE) ×1
GLOVE BIOGEL PI IND STRL 7.0 (GLOVE) ×2 IMPLANT
GLOVE BIOGEL PI INDICATOR 7.0 (GLOVE) ×4
GOWN STRL REUS W/TWL LRG LVL3 (GOWN DISPOSABLE) ×6 IMPLANT
KIT ABG SYR 3ML LUER SLIP (SYRINGE) IMPLANT
NEEDLE HYPO 25X5/8 SAFETYGLIDE (NEEDLE) IMPLANT
NS IRRIG 1000ML POUR BTL (IV SOLUTION) ×3 IMPLANT
PACK C SECTION WH (CUSTOM PROCEDURE TRAY) ×3 IMPLANT
PAD ABD 7.5X8 STRL (GAUZE/BANDAGES/DRESSINGS) ×6 IMPLANT
PAD OB MATERNITY 4.3X12.25 (PERSONAL CARE ITEMS) ×3 IMPLANT
PENCIL SMOKE EVAC W/HOLSTER (ELECTROSURGICAL) ×3 IMPLANT
SUT CHROMIC 0 CT 802H (SUTURE) IMPLANT
SUT CHROMIC 0 CTX 36 (SUTURE) ×9 IMPLANT
SUT MON AB-0 CT1 36 (SUTURE) ×3 IMPLANT
SUT PDS AB 0 CTX 36 PDP370T (SUTURE) ×3 IMPLANT
SUT PDS AB 0 CTX 60 (SUTURE) ×3 IMPLANT
SUT PLAIN 0 NONE (SUTURE) IMPLANT
SUT VIC AB 3-0 SH 27 (SUTURE) ×2
SUT VIC AB 3-0 SH 27X BRD (SUTURE) ×1 IMPLANT
SUT VIC AB 4-0 KS 27 (SUTURE) IMPLANT
SYR BULB 3OZ (MISCELLANEOUS) ×3 IMPLANT
TOWEL OR 17X24 6PK STRL BLUE (TOWEL DISPOSABLE) ×3 IMPLANT
TRAY FOLEY CATH SILVER 14FR (SET/KITS/TRAYS/PACK) IMPLANT

## 2016-05-27 NOTE — Transfer of Care (Signed)
Immediate Anesthesia Transfer of Care Note  Patient: Michelle Leon Perkin  Procedure(s) Performed: Procedure(s) with comments: REPEAT CESAREAN SECTION (N/A) - Repeat -edc 06/02/16 -NKDA need RNFA -Kennith Centerracey T to RNFA  Patient Location: PACU  Anesthesia Type:Spinal  Level of Consciousness: awake, alert  and oriented  Airway & Oxygen Therapy: Patient Spontanous Breathing  Post-op Assessment: Report given to RN and Post -op Vital signs reviewed and stable  Post vital signs: Reviewed and stable  Last Vitals:  Vitals:   05/27/16 1117  BP: 113/83  Pulse: 79  Temp: 37.1 C    Last Pain:  Vitals:   05/27/16 1117  TempSrc: Oral         Complications: No apparent anesthesia complications

## 2016-05-27 NOTE — Anesthesia Procedure Notes (Signed)
Spinal  Patient location during procedure: OR Staffing Anesthesiologist: Kaoru Rezendes Performed: anesthesiologist  Preanesthetic Checklist Completed: patient identified, site marked, surgical consent, pre-op evaluation, timeout performed, IV checked, risks and benefits discussed and monitors and equipment checked Spinal Block Patient position: sitting Prep: DuraPrep Patient monitoring: heart rate, continuous pulse ox and blood pressure Approach: right paramedian Location: L4-5 Injection technique: single-shot Needle Needle type: Whitacre  Needle gauge: 25 G Needle length: 9 cm Additional Notes Expiration date of kit checked and confirmed. Patient tolerated procedure well, without complications.       

## 2016-05-27 NOTE — Lactation Note (Signed)
This note was copied from a baby's chart. Lactation Consultation Note  P3, Baby 9 hours old.  Mother bf other children for approx 6 weeks. Mother's nipples are evert and compressible. Reviewed  Hand expression prior to latching and mother expressed drops. Assisted w/ latching in football hold STS after bath.  Sucks and swallows observed. Mom encouraged to feed baby 8-12 times/24 hours and with feeding cues.  Taught mother to flange bottom lip and compress breast during feeding to keep baby active. Discussed basics. Mom made aware of O/P services, breastfeeding support groups, community resources, and our phone # for post-discharge questions.    Patient Name: Michelle Leon Today's Date: 05/27/2016 Reason for consult: Initial assessment   Maternal Data Has patient been taught Hand Expression?: Yes Does the patient have breastfeeding experience prior to this delivery?: Yes  Feeding Feeding Type: Breast Fed  LATCH Score/Interventions Latch: Grasps breast easily, tongue down, lips flanged, rhythmical sucking.  Audible Swallowing: A few with stimulation  Type of Nipple: Everted at rest and after stimulation  Comfort (Breast/Nipple): Soft / non-tender     Hold (Positioning): Assistance needed to correctly position infant at breast and maintain latch.  LATCH Score: 8  Lactation Tools Discussed/Used     Consult Status Consult Status: Follow-up Date: 05/28/16 Follow-up type: In-patient    Dahlia ByesBerkelhammer, Amber Williard [facility] 05/27/2016, 11:19 PM

## 2016-05-27 NOTE — Anesthesia Preprocedure Evaluation (Signed)
Anesthesia Evaluation  Patient identified by MRN, date of birth, ID band Patient awake    Reviewed: Allergy & Precautions, NPO status , Patient's Chart, lab work & pertinent test results  Airway Mallampati: II  TM Distance: >3 FB Neck ROM: Full    Dental no notable dental hx.    Pulmonary neg pulmonary ROS,    Pulmonary exam normal breath sounds clear to auscultation       Cardiovascular negative cardio ROS Normal cardiovascular exam Rhythm:Regular Rate:Normal     Neuro/Psych negative neurological ROS  negative psych ROS   GI/Hepatic negative GI ROS, Neg liver ROS,   Endo/Other  negative endocrine ROS  Renal/GU negative Renal ROS  negative genitourinary   Musculoskeletal negative musculoskeletal ROS (+)   Abdominal   Peds negative pediatric ROS (+)  Hematology negative hematology ROS (+)   Anesthesia Other Findings   Reproductive/Obstetrics (+) Pregnancy                             Anesthesia Physical Anesthesia Plan  ASA: II  Anesthesia Plan: Spinal   Post-op Pain Management:    Induction:   Airway Management Planned:   Additional Equipment:   Intra-op Plan:   Post-operative Plan:   Informed Consent: I have reviewed the patients History and Physical, chart, labs and discussed the procedure including the risks, benefits and alternatives for the proposed anesthesia with the patient or authorized representative who has indicated his/her understanding and acceptance.   Dental advisory given  Plan Discussed with: CRNA  Anesthesia Plan Comments:         Anesthesia Quick Evaluation  

## 2016-05-27 NOTE — Anesthesia Postprocedure Evaluation (Signed)
Anesthesia Post Note  Patient: Michelle Leon  Procedure(s) Performed: Procedure(s) (LRB): REPEAT CESAREAN SECTION (N/A)  Patient location during evaluation: Mother Baby Anesthesia Type: Spinal Level of consciousness: awake and alert and oriented Pain management: pain level controlled Vital Signs Assessment: post-procedure vital signs reviewed and stable Respiratory status: spontaneous breathing and nonlabored ventilation Cardiovascular status: stable Postop Assessment: no headache, patient able to bend at knees, no backache, no signs of nausea or vomiting, adequate PO intake and spinal receding Anesthetic complications: no        Last Vitals:  Vitals:   05/27/16 1515 05/27/16 1533  BP: 120/70 105/73  Pulse: 75 64  Resp: 19 19  Temp:      Last Pain:  Vitals:   05/27/16 1410  TempSrc: Oral   Pain Goal:                 Land O'LakesMalinova,Dudley Mages Hristova

## 2016-05-27 NOTE — Op Note (Signed)
Cesarean Section Procedure Note   Michelle Leon  05/27/2016  Indications: Scheduled Proceedure/Maternal Request   Pre-operative Diagnosis: cesarean section; previous x2.   Post-operative Diagnosis: Same   Surgeon: Surgeon(s) and Role:    * Zelphia CairoGretchen Carlynn Leduc, MD - Primary   Assistants: Mamie Leversracy tucker, RN  Anesthesia: spinal   Procedure Details:  The patient was seen in the Holding Room. The risks, benefits, complications, treatment options, and expected outcomes were discussed with the patient. The patient concurred with the proposed plan, giving informed consent. identified as Michelle Leon and the procedure verified as C-Section Delivery. A Time Out was held and the above information confirmed.  After induction of anesthesia, the patient was draped and prepped in the usual sterile manner. A transverse was made and carried down through the subcutaneous tissue to the fascia. Fascial incision was made and extended transversely. The fascia was separated from the underlying rectus tissue superiorly and inferiorly. The peritoneum was identified and entered. Peritoneal incision was extended longitudinally. The utero-vesical peritoneal reflection was incised transversely and the bladder flap was bluntly freed from the lower uterine segment. A low transverse uterine incision was made. Delivered from cephalic presentation was a viable female infant.   Cord ph was sent the umbilical cord was clamped and cut cord blood was obtained for evaluation. The placenta was removed Intact and appeared normal. The uterine outline, tubes and ovaries appeared normal}. The uterine incision was closed with running locked sutures of 0chromic gut.   Hemostasis was observed. Lavage was carried out until clear. The fascia was then reapproximated with running sutures of 0PDS. The skin was closed with 4-0Vicryl.   Instrument, sponge, and needle counts were correct prior the abdominal closure and were correct at the conclusion  of the case.    Findings:   Estimated Blood Loss: 400cc   Urine Output: clearing  Specimens: none  Complications: no complications  Disposition: PACU - hemodynamically stable.   Maternal Condition: stable   Baby condition / location:  Couplet care / Skin to Skin  Attending Attestation: I was present and scrubbed for the entire procedure.   Signed: Surgeon(s): Zelphia CairoGretchen Brooksie Ellwanger, MD

## 2016-05-28 DIAGNOSIS — Z3483 Encounter for supervision of other normal pregnancy, third trimester: Secondary | ICD-10-CM | POA: Diagnosis not present

## 2016-05-28 DIAGNOSIS — Z3482 Encounter for supervision of other normal pregnancy, second trimester: Secondary | ICD-10-CM | POA: Diagnosis not present

## 2016-05-28 LAB — CBC
HCT: 27.2 % — ABNORMAL LOW (ref 36.0–46.0)
Hemoglobin: 9.7 g/dL — ABNORMAL LOW (ref 12.0–15.0)
MCH: 32.9 pg (ref 26.0–34.0)
MCHC: 35.7 g/dL (ref 30.0–36.0)
MCV: 92.2 fL (ref 78.0–100.0)
PLATELETS: 128 10*3/uL — AB (ref 150–400)
RBC: 2.95 MIL/uL — AB (ref 3.87–5.11)
RDW: 12.6 % (ref 11.5–15.5)
WBC: 6.4 10*3/uL (ref 4.0–10.5)

## 2016-05-28 LAB — BIRTH TISSUE RECOVERY COLLECTION (PLACENTA DONATION)

## 2016-05-28 NOTE — Plan of Care (Signed)
Problem: Activity: Goal: Will verbalize the importance of balancing activity with adequate rest periods Outcome: Completed/Met Date Met: 05/28/16 Encouraged increased ambulation today and ambulation in hallway.

## 2016-05-28 NOTE — Progress Notes (Signed)
Subjective: Postpartum Day 1: Cesarean Delivery Patient reports tolerating PO.    Objective: Vital signs in last 24 hours: Temp:  [97.7 F (36.5 C)-98.8 F (37.1 C)] 98.8 F (37.1 C) (03/21 0635) Pulse Rate:  [64-103] 77 (03/21 0635) Resp:  [15-20] 18 (03/21 0635) BP: (100-128)/(57-92) 100/71 (03/21 0635) SpO2:  [95 %-100 %] 97 % (03/21 0635) Weight:  [157 lb (71.2 kg)] 157 lb (71.2 kg) (03/20 1117)  Physical Exam:  General: alert and cooperative Lochia: appropriate Uterine Fundus: firm Incision: healing well DVT Evaluation: No evidence of DVT seen on physical exam. Negative Homan's sign. No cords or calf tenderness. No significant calf/ankle edema.   Recent Labs  05/26/16 1008 05/28/16 0510  HGB 12.1 9.7*  HCT 35.0* 27.2*    Assessment/Plan: Status post Cesarean section. Doing well postoperatively.  Continue current care.  CURTIS,CAROL G 05/28/2016, 7:51 AM

## 2016-05-28 NOTE — Lactation Note (Signed)
This note was copied from a baby's chart. Lactation Consultation Note: Infant lying between mothers breast when I arrived in her room. Mother complaints of sore nipples. Observed that mother has bilateral cracking at the back of the nipple shaft. Mother was given comfort gels. Informed mother of coconut oil and available hand pump. Mother reports that infant is breastfeeding well. She reports that latch hurts just a few seconds and then feels better. Discussed changing positions , Mother using football position with each feeding. Discussed using cross cradle and changing frequently. Discussed cluster feeding and frequent skin to skin.  Mother request follow up tomorrow before she goes home.  Patient Name: Michelle Leon [id number]'s Date: 05/28/2016 Reason for consult: Follow-up assessment   Maternal Data    Feeding Feeding Type: Breast Fed Length of feed: 10 min  LATCH Score/Interventions Latch: Grasps breast easily, tongue down, lips flanged, rhythmical sucking.  Audible Swallowing: A few with stimulation  Type of Nipple: Everted at rest and after stimulation  Comfort (Breast/Nipple): Filling, red/small blisters or bruises, mild/mod discomfort  Problem noted: Mild/Moderate discomfort;Cracked, bleeding, blisters, bruises (cracked) Interventions  (Cracked/bleeding/bruising/blister): Expressed breast milk to nipple (coconut oil; to get comfort gels from lactation) Interventions (Mild/moderate discomfort): Hand expression;Hand massage  Hold (Positioning): Assistance needed to correctly position infant at breast and maintain latch. Intervention(s): Support Pillows  LATCH Score: 7  Lactation Tools Discussed/Used     Consult Status Consult Status: Follow-up Date: 05/29/16 Follow-up type: In-patient    Stevan BornKendrick, [name] [facility] 05/28/2016, 4:58 PM

## 2016-05-29 MED ORDER — OXYCODONE-ACETAMINOPHEN 5-325 MG PO TABS: 1.0000 | ORAL_TABLET | ORAL | 0 refills | Status: AC | PRN

## 2016-05-29 MED ORDER — IBUPROFEN 600 MG PO TABS: 600.0000 mg | ORAL_TABLET | Freq: Four times a day (QID) | ORAL | 1 refills | Status: AC

## 2016-05-29 NOTE — Lactation Note (Signed)
This note was copied from a baby's chart. Lactation Consultation Note  Patient Name: Michelle Susa LofflerCrystal Marzec Today's Date: 05/29/2016 Reason for consult: Follow-up assessment;Breast/nipple pain  Mom reports pain with nursing, cracking noted on right nipple at base. Mom using cradle hold. LC assisted Mom with positioning and obtaining depth with initial latch and sustaining depth. Mom reports less discomfort with nursing. Mom using EBM/coconut oil alternated with comfort gels for tenderness. Engorgement care reviewed if needed. Advised Mom if nipple pain does not improve/resolve with better positioning to call for OP f/u. Advised of support group.   Maternal Data    Feeding Feeding Type: Breast Fed  LATCH Score/Interventions Latch: Grasps breast easily, tongue down, lips flanged, rhythmical sucking.  Audible Swallowing: A few with stimulation  Type of Nipple: Everted at rest and after stimulation  Comfort (Breast/Nipple): Filling, red/small blisters or bruises, mild/mod discomfort  Problem noted: Mild/Moderate discomfort;Cracked, bleeding, blisters, bruises (right nipple cracked at base, left nipple sore) Interventions  (Cracked/bleeding/bruising/blister): Expressed breast milk to nipple;Other (comment) (coconut oil w/EBM to alternate w/comfort gels) Interventions (Mild/moderate discomfort): Comfort gels  Hold (Positioning): Assistance needed to correctly position infant at breast and maintain latch. Intervention(s): Breastfeeding basics reviewed;Support Pillows;Position options;Skin to skin  LATCH Score: 7  Lactation Tools Discussed/Used Tools: Pump;Comfort gels;Other (comment) (coconut oil) Breast pump type: Manual   Consult Status Consult Status: Complete Date: 05/29/16 Follow-up type: In-patient    Alfred LevinsGranger, [name] 05/29/2016, 10:17 AM

## 2016-05-29 NOTE — Plan of Care (Signed)
Problem: Education: Goal: Knowledge of condition will improve Discharge education reviewed with patient and significant other. Incision care and signs of infection reviewed. Patient and significant other verbalize understanding of information.

## 2016-05-29 NOTE — Discharge Summary (Signed)
Subjective: Postpartum Day 2: Cesarean Delivery Patient reports tolerating PO, + flatus and no problems voiding.    Objective: Vital signs in last 24 hours: Temp:  [98 F (36.7 C)-98.7 F (37.1 C)] 98.2 F (36.8 C) (03/22 0500) Pulse Rate:  [69-93] 69 (03/22 0500) Resp:  [16-18] 18 (03/22 0500) BP: (102-107)/(60-68) 104/63 (03/22 0500) SpO2:  [99 %-100 %] 100 % (03/21 1510)  Physical Exam:  General: alert and cooperative Lochia: appropriate Uterine Fundus: firm Incision: healing well DVT Evaluation: No evidence of DVT seen on physical exam. Negative Homan's sign. No cords or calf tenderness. No significant calf/ankle edema.   Recent Labs  05/26/16 1008 05/28/16 0510  HGB 12.1 9.7*  HCT 35.0* 27.2*    Assessment/Plan: Status post Cesarean section. Doing well postoperatively.  Discharge home with standard precautions and return to clinic in 1-2 weeks.  Mcgregor Tinnon G 05/29/2016, 8:26 AM

## 2016-05-29 NOTE — Discharge Summary (Signed)
Obstetric Discharge Summary Reason for Admission: cesarean section Prenatal Procedures: ultrasound Intrapartum Procedures: cesarean: low cervical, transverse Postpartum Procedures: none Complications-Operative and Postpartum: none Hemoglobin  Date Value Ref Range Status  05/28/2016 9.7 (L) 12.0 - 15.0 g/dL Final   HCT  Date Value Ref Range Status  05/28/2016 27.2 (L) 36.0 - 46.0 % Final    Physical Exam:  General: alert and cooperative Lochia: appropriate Uterine Fundus: firm Incision: healing well DVT Evaluation: No evidence of DVT seen on physical exam. Negative Homan's sign. No cords or calf tenderness. No significant calf/ankle edema.  Discharge Diagnoses: Term Pregnancy-delivered  Discharge Information: Date: 05/29/2016 Activity: pelvic rest Diet: routine Medications: PNV, Ibuprofen and Percocet Condition: stable Instructions: refer to practice specific booklet Discharge to: home   Newborn Data: Live born female  Birth Weight: 7 lb 13.4 oz (3555 g) APGAR: 9, 9  Home with mother.  Michelle Leon G 05/29/2016, 8:27 AM

## 2016-07-09 DIAGNOSIS — Z1389 Encounter for screening for other disorder: Secondary | ICD-10-CM | POA: Diagnosis not present

## 2016-09-18 NOTE — Addendum Note (Signed)
Addendum  created 09/18/16 1414 by Phillips Groutarignan, Marzella Miracle, MD   Sign clinical note

## 2016-09-18 NOTE — Anesthesia Postprocedure Evaluation (Signed)
Anesthesia Post Note  Patient: Michelle Leon  Procedure(s) Performed: Procedure(s) (LRB): REPEAT CESAREAN SECTION (N/A)     Anesthesia Post Evaluation  Last Vitals:  Vitals:   05/28/16 1827 05/29/16 0500  BP: 105/60 104/63  Pulse: 82 69  Resp: 18 18  Temp: 36.7 C 36.8 C    Last Pain:  Vitals:   05/29/16 1127  TempSrc:   PainSc: 1                  Phillips Groutarignan, Svetlana Bagby

## 2016-12-30 IMAGING — CR DG CHEST 2V
2 series · 2 of 2 positions shown · non-contrast
Comparison: None.

CLINICAL DATA: 33-year-old female with a history of cough.

EXAM:
CHEST - 2 VIEW

[view not recorded (1 of 2)]
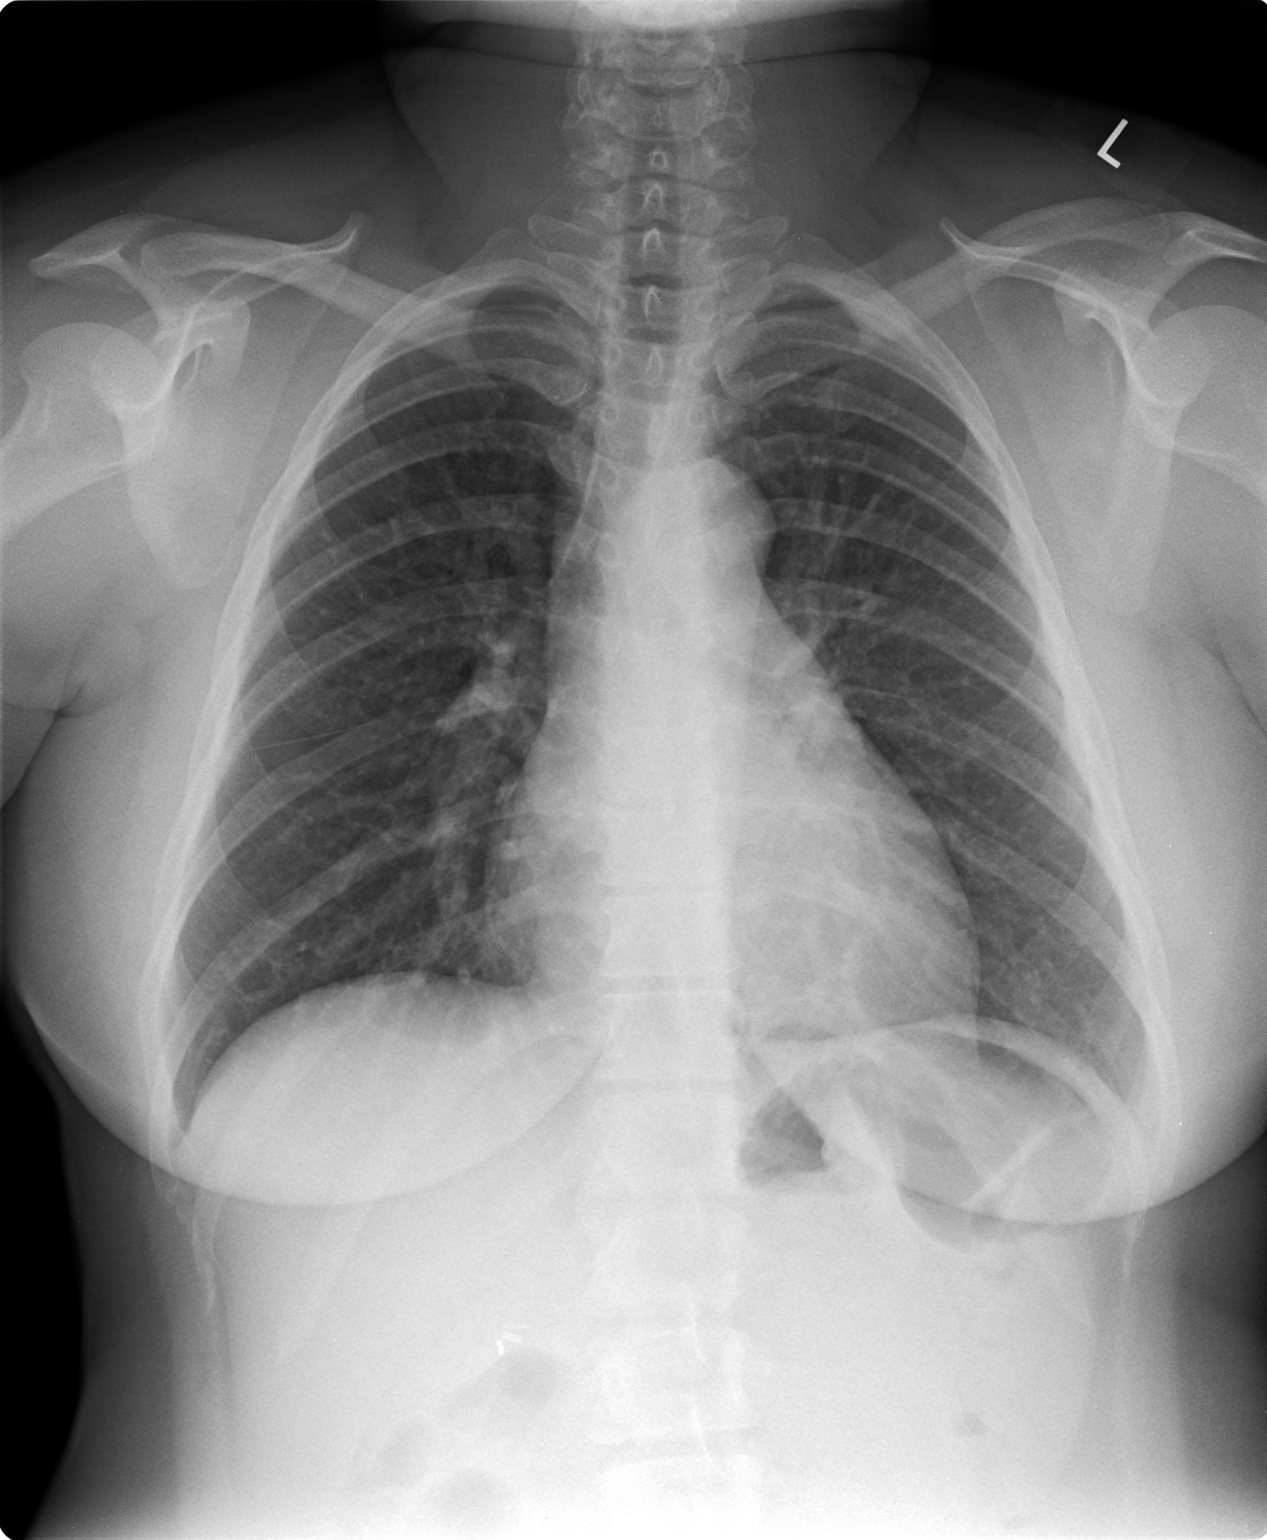

[view not recorded (2 of 2)]
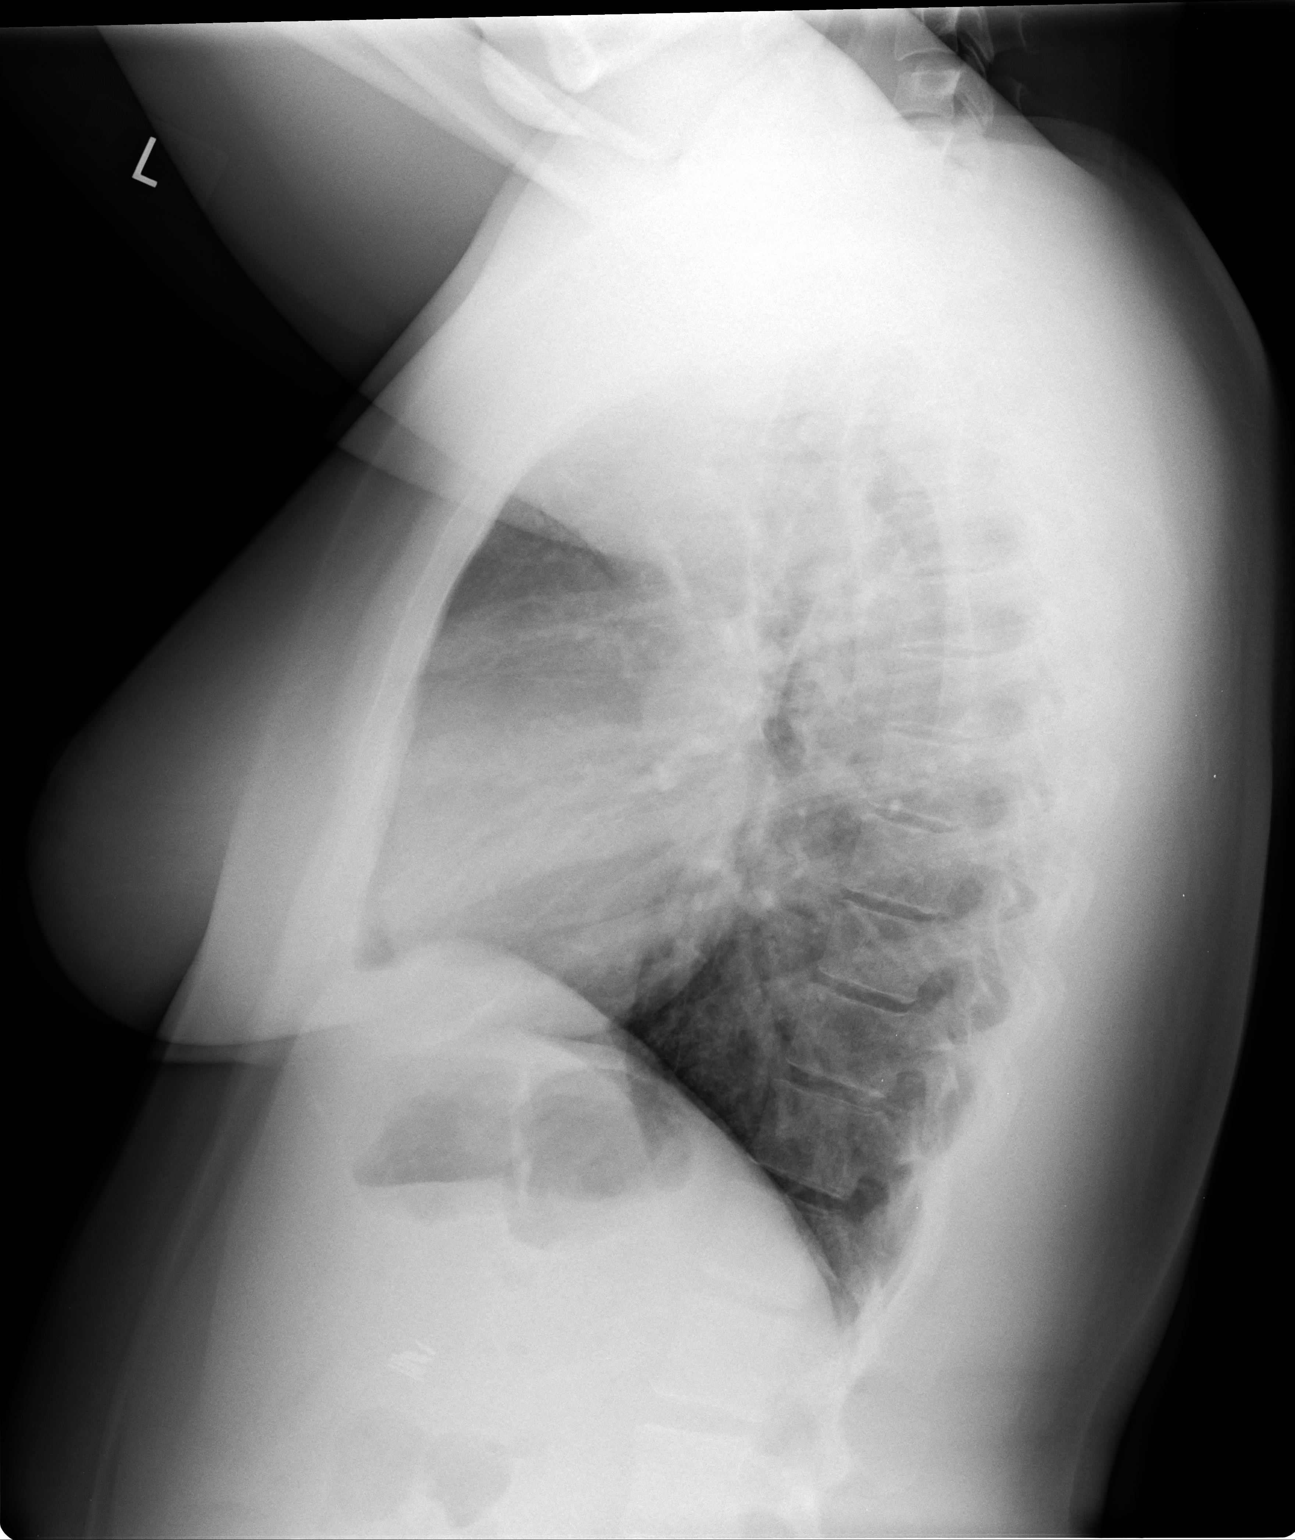

[2 of 2 positions shown; findings below may reference images not displayed]

FINDINGS: Cardiomediastinal silhouette projects within normal limits in size
and contour. No confluent airspace disease, pneumothorax, or pleural
effusion.

No displaced fracture.

Unremarkable appearance of the upper abdomen.
IMPRESSION: No radiographic evidence of acute cardiopulmonary disease.

## 2017-05-17 DIAGNOSIS — J069 Acute upper respiratory infection, unspecified: Secondary | ICD-10-CM | POA: Diagnosis not present

## 2017-11-02 DIAGNOSIS — Z6836 Body mass index (BMI) 36.0-36.9, adult: Secondary | ICD-10-CM | POA: Diagnosis not present

## 2017-11-02 DIAGNOSIS — Z01419 Encounter for gynecological examination (general) (routine) without abnormal findings: Secondary | ICD-10-CM | POA: Diagnosis not present

## 2019-02-08 DIAGNOSIS — Z6838 Body mass index (BMI) 38.0-38.9, adult: Secondary | ICD-10-CM | POA: Diagnosis not present

## 2019-02-08 DIAGNOSIS — Z01419 Encounter for gynecological examination (general) (routine) without abnormal findings: Secondary | ICD-10-CM | POA: Diagnosis not present

## 2020-01-05 DIAGNOSIS — N912 Amenorrhea, unspecified: Secondary | ICD-10-CM | POA: Diagnosis not present

## 2020-04-12 DIAGNOSIS — K5989 Other specified functional intestinal disorders: Secondary | ICD-10-CM | POA: Diagnosis not present

## 2020-04-12 DIAGNOSIS — K5900 Constipation, unspecified: Secondary | ICD-10-CM | POA: Diagnosis not present

## 2020-04-12 DIAGNOSIS — Z9049 Acquired absence of other specified parts of digestive tract: Secondary | ICD-10-CM | POA: Diagnosis not present

## 2020-04-12 DIAGNOSIS — R1033 Periumbilical pain: Secondary | ICD-10-CM | POA: Diagnosis not present

## 2020-04-12 DIAGNOSIS — R1084 Generalized abdominal pain: Secondary | ICD-10-CM | POA: Diagnosis not present

## 2020-04-12 DIAGNOSIS — R109 Unspecified abdominal pain: Secondary | ICD-10-CM | POA: Diagnosis not present

## 2020-04-12 DIAGNOSIS — N838 Other noninflammatory disorders of ovary, fallopian tube and broad ligament: Secondary | ICD-10-CM | POA: Diagnosis not present

## 2020-04-12 DIAGNOSIS — R110 Nausea: Secondary | ICD-10-CM | POA: Diagnosis not present

## 2020-04-12 DIAGNOSIS — R1900 Intra-abdominal and pelvic swelling, mass and lump, unspecified site: Secondary | ICD-10-CM | POA: Diagnosis not present

## 2020-04-13 DIAGNOSIS — R109 Unspecified abdominal pain: Secondary | ICD-10-CM | POA: Diagnosis not present

## 2020-04-13 DIAGNOSIS — K5989 Other specified functional intestinal disorders: Secondary | ICD-10-CM | POA: Diagnosis not present

## 2020-04-13 DIAGNOSIS — K5900 Constipation, unspecified: Secondary | ICD-10-CM | POA: Diagnosis not present

## 2020-04-13 DIAGNOSIS — Z9049 Acquired absence of other specified parts of digestive tract: Secondary | ICD-10-CM | POA: Diagnosis not present

## 2020-04-18 DIAGNOSIS — N838 Other noninflammatory disorders of ovary, fallopian tube and broad ligament: Secondary | ICD-10-CM | POA: Diagnosis not present

## 2020-04-18 DIAGNOSIS — R1900 Intra-abdominal and pelvic swelling, mass and lump, unspecified site: Secondary | ICD-10-CM | POA: Diagnosis not present

## 2020-04-18 DIAGNOSIS — R112 Nausea with vomiting, unspecified: Secondary | ICD-10-CM | POA: Diagnosis not present

## 2020-04-26 DIAGNOSIS — R1900 Intra-abdominal and pelvic swelling, mass and lump, unspecified site: Secondary | ICD-10-CM | POA: Diagnosis not present

## 2020-04-26 DIAGNOSIS — Z01812 Encounter for preprocedural laboratory examination: Secondary | ICD-10-CM | POA: Diagnosis not present

## 2020-04-27 DIAGNOSIS — C569 Malignant neoplasm of unspecified ovary: Secondary | ICD-10-CM | POA: Diagnosis not present

## 2020-04-27 DIAGNOSIS — C561 Malignant neoplasm of right ovary: Secondary | ICD-10-CM | POA: Diagnosis not present

## 2020-04-27 DIAGNOSIS — D259 Leiomyoma of uterus, unspecified: Secondary | ICD-10-CM | POA: Diagnosis not present

## 2020-04-27 DIAGNOSIS — R1900 Intra-abdominal and pelvic swelling, mass and lump, unspecified site: Secondary | ICD-10-CM | POA: Diagnosis not present

## 2020-04-27 DIAGNOSIS — C563 Malignant neoplasm of bilateral ovaries: Secondary | ICD-10-CM | POA: Diagnosis not present

## 2020-04-27 DIAGNOSIS — D3912 Neoplasm of uncertain behavior of left ovary: Secondary | ICD-10-CM | POA: Diagnosis not present

## 2020-04-27 DIAGNOSIS — Z79899 Other long term (current) drug therapy: Secondary | ICD-10-CM | POA: Diagnosis not present

## 2020-04-27 DIAGNOSIS — E669 Obesity, unspecified: Secondary | ICD-10-CM | POA: Diagnosis not present

## 2020-04-27 DIAGNOSIS — Z9049 Acquired absence of other specified parts of digestive tract: Secondary | ICD-10-CM | POA: Diagnosis not present

## 2020-04-27 DIAGNOSIS — Z20822 Contact with and (suspected) exposure to covid-19: Secondary | ICD-10-CM | POA: Diagnosis not present

## 2020-04-27 DIAGNOSIS — Z6834 Body mass index (BMI) 34.0-34.9, adult: Secondary | ICD-10-CM | POA: Diagnosis not present

## 2020-05-17 DIAGNOSIS — Z09 Encounter for follow-up examination after completed treatment for conditions other than malignant neoplasm: Secondary | ICD-10-CM | POA: Diagnosis not present

## 2020-05-17 DIAGNOSIS — C569 Malignant neoplasm of unspecified ovary: Secondary | ICD-10-CM | POA: Diagnosis not present

## 2020-06-12 DIAGNOSIS — Z9889 Other specified postprocedural states: Secondary | ICD-10-CM | POA: Diagnosis not present

## 2020-06-12 DIAGNOSIS — C569 Malignant neoplasm of unspecified ovary: Secondary | ICD-10-CM | POA: Diagnosis not present

## 2020-06-28 DIAGNOSIS — Z01419 Encounter for gynecological examination (general) (routine) without abnormal findings: Secondary | ICD-10-CM | POA: Diagnosis not present

## 2020-06-28 DIAGNOSIS — Z6833 Body mass index (BMI) 33.0-33.9, adult: Secondary | ICD-10-CM | POA: Diagnosis not present

## 2020-07-24 DIAGNOSIS — R109 Unspecified abdominal pain: Secondary | ICD-10-CM | POA: Diagnosis not present

## 2020-07-24 DIAGNOSIS — C569 Malignant neoplasm of unspecified ovary: Secondary | ICD-10-CM | POA: Diagnosis not present

## 2020-07-25 DIAGNOSIS — K3189 Other diseases of stomach and duodenum: Secondary | ICD-10-CM | POA: Diagnosis not present

## 2020-07-25 DIAGNOSIS — C569 Malignant neoplasm of unspecified ovary: Secondary | ICD-10-CM | POA: Diagnosis not present

## 2020-07-25 DIAGNOSIS — R109 Unspecified abdominal pain: Secondary | ICD-10-CM | POA: Diagnosis not present

## 2020-08-07 DIAGNOSIS — K432 Incisional hernia without obstruction or gangrene: Secondary | ICD-10-CM | POA: Diagnosis not present

## 2020-08-15 DIAGNOSIS — Z1231 Encounter for screening mammogram for malignant neoplasm of breast: Secondary | ICD-10-CM | POA: Diagnosis not present
